# Patient Record
Sex: Male | Born: 1985 | Race: White | Hispanic: No | Marital: Married | Smoking: Never smoker
Health system: Southern US, Community
[De-identification: ages and names within clinical notes are randomized; demographics above are authoritative.]

## PROBLEM LIST (undated history)

## (undated) DIAGNOSIS — K219 Gastro-esophageal reflux disease without esophagitis: Secondary | ICD-10-CM

## (undated) DIAGNOSIS — R51 Headache: Secondary | ICD-10-CM

## (undated) DIAGNOSIS — R519 Headache, unspecified: Secondary | ICD-10-CM

## (undated) DIAGNOSIS — F329 Major depressive disorder, single episode, unspecified: Secondary | ICD-10-CM

## (undated) DIAGNOSIS — K4090 Unilateral inguinal hernia, without obstruction or gangrene, not specified as recurrent: Secondary | ICD-10-CM

## (undated) DIAGNOSIS — F32A Depression, unspecified: Secondary | ICD-10-CM

## (undated) DIAGNOSIS — M1990 Unspecified osteoarthritis, unspecified site: Secondary | ICD-10-CM

## (undated) DIAGNOSIS — N200 Calculus of kidney: Secondary | ICD-10-CM

## (undated) DIAGNOSIS — F419 Anxiety disorder, unspecified: Secondary | ICD-10-CM

## (undated) HISTORY — DX: Anxiety disorder, unspecified: F41.9

## (undated) HISTORY — DX: Unilateral inguinal hernia, without obstruction or gangrene, not specified as recurrent: K40.90

## (undated) HISTORY — DX: Hemochromatosis, unspecified: E83.119

## (undated) HISTORY — DX: Depression, unspecified: F32.A

## (undated) HISTORY — DX: Major depressive disorder, single episode, unspecified: F32.9

---
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
## 2003-03-14 HISTORY — PX: WISDOM TOOTH EXTRACTION: SHX21

## 2011-08-12 DIAGNOSIS — N200 Calculus of kidney: Secondary | ICD-10-CM

## 2011-08-12 HISTORY — DX: Calculus of kidney: N20.0

## 2011-08-26 ENCOUNTER — Encounter (HOSPITAL_COMMUNITY): Payer: Self-pay | Admitting: Emergency Medicine

## 2011-08-26 ENCOUNTER — Emergency Department (HOSPITAL_COMMUNITY)
Admission: EM | Admit: 2011-08-26 | Discharge: 2011-08-26 | Disposition: A | Discharge status: Home / Self Care | Payer: Medicaid Other | Attending: Emergency Medicine | Admitting: Emergency Medicine

## 2011-08-26 DIAGNOSIS — N390 Urinary tract infection, site not specified: Secondary | ICD-10-CM | POA: Insufficient documentation

## 2011-08-26 LAB — URINE MICROSCOPIC-ADD ON

## 2011-08-26 LAB — URINALYSIS, ROUTINE W REFLEX MICROSCOPIC
Glucose, UA: NEGATIVE mg/dL
Ketones, ur: 15 mg/dL — AB
Nitrite: POSITIVE — AB
Protein, ur: NEGATIVE mg/dL
Specific Gravity, Urine: 1.012 (ref 1.005–1.030)
Urobilinogen, UA: 1 mg/dL (ref 0.0–1.0)
pH: 6 (ref 5.0–8.0)

## 2011-08-26 MED ORDER — CEPHALEXIN 500 MG PO CAPS: 500.0000 mg | ORAL_CAPSULE | Freq: Two times a day (BID) | ORAL | Status: DC

## 2011-08-26 MED ORDER — CEFTRIAXONE SODIUM 250 MG IJ SOLR
250.0000 mg | Freq: Once | INTRAMUSCULAR | Status: AC
  Administered 2011-08-26: 250 mg via INTRAMUSCULAR
  Filled 2011-08-26: qty 250

## 2011-08-26 MED ORDER — LIDOCAINE HCL (PF) 1 % IJ SOLN
INTRAMUSCULAR | Status: AC
  Administered 2011-08-26: 5 mL
  Filled 2011-08-26: qty 5

## 2011-08-26 MED ORDER — NAPROXEN 375 MG PO TABS: 375.0000 mg | ORAL_TABLET | Freq: Two times a day (BID) | ORAL | Status: DC

## 2011-08-26 NOTE — Discharge Instructions (Signed)
Urinary Tract Infection Infections of the urinary tract can start in several places. A bladder infection (cystitis), a kidney infection (pyelonephritis), and a prostate infection (prostatitis) are different types of urinary tract infections (UTIs). They usually get better if treated with medicines (antibiotics) that kill germs. Take all the medicine until it is gone. You or your child may feel better in a few days, but TAKE ALL MEDICINE or the infection may not respond and may become more difficult to treat. HOME CARE INSTRUCTIONS   Drink enough water and fluids to keep the urine clear or pale yellow. Cranberry juice is especially recommended, in addition to large amounts of water.   Avoid caffeine, tea, and carbonated beverages. They tend to irritate the bladder.   Alcohol may irritate the prostate.   Only take over-the-counter or prescription medicines for pain, discomfort, or fever as directed by your caregiver.  To prevent further infections:  Empty the bladder often. Avoid holding urine for long periods of time.   After a bowel movement, women should cleanse from front to back. Use each tissue only once.   Empty the bladder before and after sexual intercourse.  FINDING OUT THE RESULTS OF YOUR TEST Not all test results are available during your visit. If your or your child's test results are not back during the visit, make an appointment with your caregiver to find out the results. Do not assume everything is normal if you have not heard from your caregiver or the medical facility. It is important for you to follow up on all test results. SEEK MEDICAL CARE IF:   There is back pain.   Your baby is older than 3 months with a rectal temperature of 100.5 F (38.1 C) or higher for more than 1 day.   Your or your child's problems (symptoms) are no better in 3 days. Return sooner if you or your child is getting worse.  SEEK IMMEDIATE MEDICAL CARE IF:   There is severe back pain or lower  abdominal pain.   You or your child develops chills.   You have a fever.   Your baby is older than 3 months with a rectal temperature of 102 F (38.9 C) or higher.   Your baby is 3 months old or younger with a rectal temperature of 100.4 F (38 C) or higher.   There is nausea or vomiting.   There is continued burning or discomfort with urination.  MAKE SURE YOU:   Understand these instructions.   Will watch your condition.   Will get help right away if you are not doing well or get worse.  Document Released: 12/07/2004 Document Revised: 02/16/2011 Document Reviewed: 07/12/2006 [employer] Patient Information 2012 [employer].Urinary Tract Infection Infections of the urinary tract can start in several places. A bladder infection (cystitis), a kidney infection (pyelonephritis), and a prostate infection (prostatitis) are different types of urinary tract infections (UTIs). They usually get better if treated with medicines (antibiotics) that kill germs. Take all the medicine until it is gone. You or your child may feel better in a few days, but TAKE ALL MEDICINE or the infection may not respond and may become more difficult to treat. HOME CARE INSTRUCTIONS   Drink enough water and fluids to keep the urine clear or pale yellow. Cranberry juice is especially recommended, in addition to large amounts of water.   Avoid caffeine, tea, and carbonated beverages. They tend to irritate the bladder.   Alcohol may irritate the prostate.   Only take   over-the-counter or prescription medicines for pain, discomfort, or fever as directed by your caregiver.  To prevent further infections:  Empty the bladder often. Avoid holding urine for long periods of time.   After a bowel movement, women should cleanse from front to back. Use each tissue only once.   Empty the bladder before and after sexual intercourse.  FINDING OUT THE RESULTS OF YOUR TEST Not all test results are available during your  visit. If your or your child's test results are not back during the visit, make an appointment with your caregiver to find out the results. Do not assume everything is normal if you have not heard from your caregiver or the medical facility. It is important for you to follow up on all test results. SEEK MEDICAL CARE IF:   There is back pain.   Your baby is older than 3 months with a rectal temperature of 100.5 F (38.1 C) or higher for more than 1 day.   Your or your child's problems (symptoms) are no better in 3 days. Return sooner if you or your child is getting worse.  SEEK IMMEDIATE MEDICAL CARE IF:   There is severe back pain or lower abdominal pain.   You or your child develops chills.   You have a fever.   Your baby is older than 3 months with a rectal temperature of 102 F (38.9 C) or higher.   Your baby is 3 months old or younger with a rectal temperature of 100.4 F (38 C) or higher.   There is nausea or vomiting.   There is continued burning or discomfort with urination.  MAKE SURE YOU:   Understand these instructions.   Will watch your condition.   Will get help right away if you are not doing well or get worse.  Document Released: 12/07/2004 Document Revised: 02/16/2011 Document Reviewed: 07/12/2006 [employer] Patient Information 2012 [employer]. 

## 2011-08-26 NOTE — ED Notes (Signed)
Pt denies any pain or questions upon discharge. 

## 2011-08-26 NOTE — ED Provider Notes (Signed)
History     CSN: [id number]  Arrival date & time 08/26/11  2006   First MD Initiated Contact with Patient 08/26/11 2122      Chief Complaint  Patient presents with  . Groin Pain    (Consider location/radiation/quality/duration/timing/severity/associated sxs/prior treatment) HPI Comments: Patient with no significant past medical history presents emergency department with chief complaint of right-sided flank pain and testicular pain.  Patient states his pain began acutely this evening and is associated with dysuria.  Patient states originally the pain was severe 10/10, however patient believes he may pass a kidney stone because his pain has subsided.  Patient denies fevers, night sweats, chills, abdominal pain but reports nausea and emesis when the pain was at its worst.  Patient has no other complaints at this time.  Patient is a 26 y.o. male presenting with groin pain. The history is provided by the patient.  Groin Pain    History reviewed. No pertinent past medical history.  History reviewed. No pertinent past surgical history.  History reviewed. No pertinent family history.  History  Substance Use Topics  . Smoking status: Never Smoker   . Smokeless tobacco: Not on file  . Alcohol Use: Yes     Occassional Use      Review of Systems  All other systems reviewed and are negative.    Allergies  Review of patient's allergies indicates no known allergies.  Home Medications  No current outpatient prescriptions on file.  BP 129/74  Pulse 79  Temp 98.3 F (36.8 C) (Oral)  Resp 18  SpO2 98%  Physical Exam  Nursing note and vitals reviewed. Constitutional: He is oriented to person, place, and time. He appears well-developed and well-nourished. No distress.       Patient with vital signs normal in no acute distress and did not appear to be in any pain at this time.  HENT:  Head: Normocephalic and atraumatic.  Eyes: Conjunctivae and EOM are normal.  Neck: Normal  range of motion.  Pulmonary/Chest: Effort normal.  Abdominal:       No suprapubic or lower abdominal tenderness to palpation.  Genitourinary:       Exam chaperoned, cremaster reflex intact, no testicular warmth, erythema, tenderness or swelling.  No right or left CVA tenderness, penis normal.  Musculoskeletal: Normal range of motion.  Neurological: He is alert and oriented to person, place, and time.  Skin: Skin is warm and dry. No rash noted. He is not diaphoretic.  Psychiatric: He has a normal mood and affect. His behavior is normal.    ED Course  Procedures (including critical care time)  Labs Reviewed  URINALYSIS, ROUTINE W REFLEX MICROSCOPIC - Abnormal; Notable for the following:    Color, Urine ORANGE (*)  BIOCHEMICALS MAY BE AFFECTED BY COLOR   APPearance CLOUDY (*)     Hgb urine dipstick LARGE (*)     Bilirubin Urine SMALL (*)     Ketones, ur 15 (*)     Nitrite POSITIVE (*)     Leukocytes, UA SMALL (*)     All other components within normal limits  URINE MICROSCOPIC-ADD ON   No results found.   No diagnosis found.    MDM  Urinary tract infection, question passed kidney stone  Pt has been diagnosed with a UTI. Pt is afebrile, no CVA tenderness, normotensive, and denies N/V. Pt to be dc home with antibiotics and instructions to follow up with PCP if symptoms persist.  Jaci Carrel, [place] 08/26/11 2209

## 2011-08-26 NOTE — ED Notes (Signed)
Patient complaining of right testicle swelling that started suddenly tonight.  Patient states that he took 1200 mg of ibuprofen; and that the pain has went away on the way to the ED.  Patient also reporting right flank pain and feels that he may have passed a kidney stone.  Patient reports urinary frequency and little urinary output.

## 2011-08-27 NOTE — ED Provider Notes (Signed)
Medical screening examination/treatment/procedure(s) were performed by non-physician practitioner and as supervising physician I was immediately available for consultation/collaboration.    Nelia Shi, MD 08/27/11 [phone]

## 2011-08-31 ENCOUNTER — Encounter (HOSPITAL_COMMUNITY): Payer: Self-pay | Admitting: *Deleted

## 2011-08-31 ENCOUNTER — Emergency Department (HOSPITAL_COMMUNITY): Payer: Medicaid Other

## 2011-08-31 ENCOUNTER — Emergency Department (HOSPITAL_COMMUNITY)
Admission: EM | Admit: 2011-08-31 | Discharge: 2011-08-31 | Disposition: A | Discharge status: Home / Self Care | Payer: Medicaid Other | Attending: Emergency Medicine | Admitting: Emergency Medicine

## 2011-08-31 DIAGNOSIS — R109 Unspecified abdominal pain: Secondary | ICD-10-CM | POA: Insufficient documentation

## 2011-08-31 DIAGNOSIS — Z87442 Personal history of urinary calculi: Secondary | ICD-10-CM | POA: Insufficient documentation

## 2011-08-31 DIAGNOSIS — F17200 Nicotine dependence, unspecified, uncomplicated: Secondary | ICD-10-CM | POA: Insufficient documentation

## 2011-08-31 DIAGNOSIS — N200 Calculus of kidney: Secondary | ICD-10-CM | POA: Insufficient documentation

## 2011-08-31 DIAGNOSIS — R300 Dysuria: Secondary | ICD-10-CM | POA: Insufficient documentation

## 2011-08-31 HISTORY — DX: Calculus of kidney: N20.0

## 2011-08-31 LAB — URINALYSIS, ROUTINE W REFLEX MICROSCOPIC
Bilirubin Urine: NEGATIVE
Ketones, ur: NEGATIVE mg/dL
Nitrite: NEGATIVE
Protein, ur: 100 mg/dL — AB
Urobilinogen, UA: 0.2 mg/dL (ref 0.0–1.0)

## 2011-08-31 MED ORDER — OXYCODONE-ACETAMINOPHEN 10-650 MG PO TABS: 1.0000 | ORAL_TABLET | Freq: Four times a day (QID) | ORAL | Status: AC | PRN

## 2011-08-31 MED ORDER — KETOROLAC TROMETHAMINE 30 MG/ML IJ SOLN
30.0000 mg | Freq: Once | INTRAMUSCULAR | Status: AC
  Administered 2011-08-31: 30 mg via INTRAVENOUS
  Filled 2011-08-31: qty 1

## 2011-08-31 MED ORDER — DEXTROSE 5 % IV SOLN
1.0000 g | Freq: Once | INTRAVENOUS | Status: AC
  Administered 2011-08-31: 1 g via INTRAVENOUS
  Filled 2011-08-31: qty 10

## 2011-08-31 MED ORDER — SODIUM CHLORIDE 0.9 % IV BOLUS (SEPSIS)
500.0000 mL | Freq: Once | INTRAVENOUS | Status: AC
  Administered 2011-08-31: 500 mL via INTRAVENOUS

## 2011-08-31 NOTE — Discharge Instructions (Signed)
Mr Dunker you have a kidney stone 5mm on the R.  Follow up with the urologist listed below.  Continue straining your urine and continue the keflex.  Take the percocet for pain but do not drive with this medication.  Return for severe pain or other concerns.  You will be called if the cultures are positive for gonorrhea or chlamydia.   Kidney Stones Kidney stones (ureteral lithiasis) are solid masses that form inside your kidneys. The intense pain is caused by the stone moving through the kidney, ureter, bladder, and urethra (urinary tract). When the stone moves, the ureter starts to spasm around the stone. The stone is usually passed in the urine.  HOME CARE  Drink enough fluids to keep your pee (urine) clear or pale yellow. This helps to get the stone out.   Strain all pee through the provided strainer. Do not pee without peeing through the strainer, not even once. If you pee the stone out, catch it. The stone may be as small as a grain of salt. Take this to your doctor.   Only take medicine as told by your doctor.   Follow up with your doctor as told.   Get follow-up X-rays as told by your doctor.  GET HELP RIGHT AWAY IF:   Your pain does not get better with medicine.   You have a fever.   Your pain increases and gets worse over 18 hours.   You have new belly (abdominal) pain.   You feel faint or pass out.  MAKE SURE YOU:   Understand these instructions.   Will watch your condition.   Will get help right away if you are not doing well or get worse.  Document Released: 08/16/2007 Document Revised: 02/16/2011 Document Reviewed: 12/25/2008 [facility] Patient Information 2012 [place], [place].Kidney Stones Kidney stones (ureteral lithiasis) are solid masses that form inside your kidneys. The intense pain is caused by the stone moving through the kidney, ureter, bladder, and urethra (urinary tract). When the stone moves, the ureter starts to spasm around the stone. The stone is usually  passed in the urine.  HOME CARE  Drink enough fluids to keep your pee (urine) clear or pale yellow. This helps to get the stone out.   Strain all pee through the provided strainer. Do not pee without peeing through the strainer, not even once. If you pee the stone out, catch it. The stone may be as small as a grain of salt. Take this to your doctor.   Only take medicine as told by your doctor.   Follow up with your doctor as told.   Get follow-up X-rays as told by your doctor.  GET HELP RIGHT AWAY IF:   Your pain does not get better with medicine.   You have a fever.   Your pain increases and gets worse over 18 hours.   You have new belly (abdominal) pain.   You feel faint or pass out.  MAKE SURE YOU:   Understand these instructions.   Will watch your condition.   Will get help right away if you are not doing well or get worse.  Document Released: 08/16/2007 Document Revised: 02/16/2011 Document Reviewed: 12/25/2008 [facility] Patient Information 2012 [place], [place].Lithotripsy for Kidney Stones WHAT ARE KIDNEY STONES? The kidneys filter blood for chemicals the body cannot use. These waste chemicals are eliminated in the urine. They are removed from the body. Under some conditions, these chemicals may become concentrated. When this happens, they form crystals in the  urine. When these crystals build up and stick together, stones may form. When these stones block the flow of urine through the urinary tract, they may cause severe pain. The urinary tract is very sensitive to blockage and stretching by the stone. WHAT IS LITHOTRIPSY? Lithotripsy is a treatment that can sometimes help eliminate kidney stones and pain faster. A form of lithotripsy, also known as ESWL (extracorporeal shock wave lithotripsy), is a nonsurgical procedure that helps your body rid itself of the kidney stone with a minimum amount of pain. EWSL is a method of crushing a kidney stone with shock waves. These shock  waves pass through your body. They cause the kidney stones to crumble while still in the urinary tract. It is then easier for the smaller pieces of stone to pass in the urine. Lithotripsy usually takes about an hour. It is done in a hospital, a lithotripsy center, or a mobile unit. It usually does not require an overnight stay. Your caregiver will instruct you on preparation for the procedure. Your caregiver will tell you what to expect afterward. LET YOUR CAREGIVER KNOW ABOUT:  Allergies.   Medicines taken including herbs, eye drops, over the counter medicines (including aspirin, aleve, or motrin for treatment of inflammatory conditions) and creams.   Use of steroids (by mouth or creams).   Previous problems with anesthetics or novocaine.   Possibility of pregnancy, if this applies.   History of blood clots (thrombophlebitis).   History of bleeding or blood problems.   Previous surgery.   Other health problems.  RISKS AND COMPLICATIONS Complications of lithotripsy are uncommon, but include the following:  Infection.   Bleeding of the kidney.   Bruising of the kidney or skin.   Obstruction of the ureter (the passageway from the kidney to the bladder).   Failure of the stone to fragment (break apart).  PROCEDURE A stent (flexible tube with holes) may be placed in your ureter. The ureter is the tube that transports the urine from the kidneys to the bladder. Your caregiver may place a stent before the procedure. This will help keep urine flowing from the kidney if the fragments of the stone block the ureter. You may receive an intravenous (IV) line to give you fluids and medicines. These medicines may help you relax or make you sleep. During the procedure, you will lie comfortably on a fluid-filled cushion or in a warm-water bath. After an x-ray or ultrasound locates your stone, shock waves are aimed at the stone. If you are awake, you may feel a tapping sensation (feeling) as the  shock waves pass through your body. If large stone particles remain after treatment, a second procedure may be necessary at a later date. For comfort during the test:  Relax as much as possible.   Try to remain still as much as possible.   Try to follow instructions to speed up the test.   Let your caregiver know if you are uncomfortable, anxious, or in pain.  AFTER THE PROCEDURE  After surgery, you will be taken to the recovery area. A nurse will watch and check your progress. Once you're awake, stable, and taking fluids well, you will be allowed to go home as long as there are no problems. You may be prescribed antibiotics (medicines that kill germs) to help prevent infection. You may also be prescribed pain medicine if needed. In a week or two, your doctor may remove your stent, if you have one. Your caregiver will check to see whether  or not stone particles remain. PASSING THE STONE It may take anywhere from a day to several weeks for the stone particles to leave your body. During this time, drink at least 8 to 12 eight ounce glasses of water every day. It is normal for your urine to be cloudy or slightly bloody for a few weeks following this procedure. You may even see small pieces of stone in your urine. A slight fever and some pain are also normal. Your caregiver may ask you to strain your urine to collect some stone particles for chemical analysis. If you find particles while straining the urine, save them. Analysis tells you and the caregiver what the stone is made of. Knowing this may help prevent future stones. PREVENTING FUTURE STONES  Drink about 8 to 12, eight-ounce glasses of water every day.   Follow the diet your caregiver recommends.   Take your prescribed medicine.   See your caregiver regularly for checkups.  SEEK IMMEDIATE MEDICAL CARE IF:  You develop an oral temperature above 102 F (38.9 C), or as your caregiver suggests.   Your pain is not relieved by medicine.     You develop nausea (feeling sick to your stomach) and vomiting.   You develop heavy bleeding.   You have difficulty urinating.  Document Released: 02/25/2000 Document Revised: 02/16/2011 Document Reviewed: 12/20/2007 [facility] Patient Information 2012 [place], [place].

## 2011-08-31 NOTE — ED Notes (Signed)
Patient reports he was seen on Saturday and dx with kidney stones.  He is continuing to having right side flank pain and he cannot rest.  Patient states he has pain down into his right testicle.  Patient is able to pass urine, but small amount than usual

## 2011-08-31 NOTE — ED Notes (Signed)
Pt returned from CT °

## 2011-08-31 NOTE — ED Provider Notes (Signed)
History     CSN: [id number]  Arrival date & time 08/31/11  1425   First MD Initiated Contact with Patient 08/31/11 1825      Chief Complaint  Patient presents with  . Flank Pain    (Consider location/radiation/quality/duration/timing/severity/associated sxs/prior treatment) Patient is a 26 y.o. male presenting with flank pain. The history is provided by the patient and a significant other. No language interpreter was used.  Flank Pain This is a recurrent problem. The current episode started in the past 7 days. The problem occurs daily.   26 year old coming in today with recent history of UTI and possible kidney stone with no CT of abdomen. Patient states the pain is getting worse and he does have burning with urination and right testicular pain with urination only. Patient was put on Keflex 4 days ago with no improvement. He does have right CVA tenderness as well. States he does have a clear drainage after urinating from his penis. Intermittant testicular pain.  None now.  His girlfriend states that she recently had HPV and no LEEP procedure done. Patient states he has been unable to work and he will need a work note.    Past Medical History  Diagnosis Date  . Kidney stone     History reviewed. No pertinent past surgical history.  No family history on file.  History  Substance Use Topics  . Smoking status: Current Some Day Smoker  . Smokeless tobacco: Not on file  . Alcohol Use: Yes     Occassional Use      Review of Systems  Constitutional: Negative.   HENT: Negative.   Eyes: Negative.   Respiratory: Negative.   Cardiovascular: Negative.   Gastrointestinal: Negative.   Genitourinary: Positive for dysuria, urgency, flank pain, discharge and difficulty urinating. Negative for penile swelling, penile pain and testicular pain.  Neurological: Negative.   Psychiatric/Behavioral: Negative.   All other systems reviewed and are negative.    Allergies  Review of  patient's allergies indicates no known allergies.  Home Medications   Current Outpatient Rx  Name Route Sig Dispense Refill  . CEPHALEXIN 500 MG PO CAPS Oral Take 500 mg by mouth 2 (two) times daily. Take for 10 days. First dose 08/27/2011    . CRANBERRY PO Oral Take 1 tablet by mouth daily.    Marland Kitchen NAPROXEN 375 MG PO TABS Oral Take 1 tablet (375 mg total) by mouth 2 (two) times daily. 20 tablet 0    BP 143/86  Pulse 80  Temp 97.6 F (36.4 C) (Oral)  Resp 18  Ht 5\' 11"  (1.803 m)  Wt 162 lb (73.483 kg)  BMI 22.59 kg/m2  SpO2 100%  Physical Exam  Nursing note and vitals reviewed. Constitutional: He is oriented to person, place, and time. He appears well-developed and well-nourished.  HENT:  Head: Normocephalic.  Eyes: Conjunctivae and EOM are normal. Pupils are equal, round, and reactive to light.  Neck: Normal range of motion. Neck supple.  Cardiovascular: Normal rate.   Pulmonary/Chest: Effort normal.  Abdominal: Soft.  Genitourinary: Testes normal and penis normal. Right testis shows no mass, no swelling and no tenderness. Left testis shows no mass, no swelling and no tenderness.  Musculoskeletal: Normal range of motion.  Neurological: He is alert and oriented to person, place, and time.  Skin: Skin is warm and dry.  Psychiatric: He has a normal mood and affect.    ED Course  Procedures (including critical care time)   Labs Reviewed  URINALYSIS,  ROUTINE W REFLEX MICROSCOPIC   No results found.   No diagnosis found.    MDM  CVA tenderness with + kidney stone per CT scan.  On keflex from evaluation last week with + UTI.    U/a normal today. Continue keflex and follow up with urologist. GC/chlamydia pending. Strain urine.  Return for n/v or high fever severe pain.   Labs Reviewed  URINALYSIS, ROUTINE W REFLEX MICROSCOPIC - Abnormal; Notable for the following:    APPearance CLOUDY (*)     Specific Gravity, Urine 1.035 (*)     Hgb urine dipstick LARGE (*)      Protein, ur 100 (*)     All other components within normal limits  URINE MICROSCOPIC-ADD ON - Abnormal; Notable for the following:    Crystals CA OXALATE CRYSTALS (*)     All other components within normal limits  GC/CHLAMYDIA PROBE AMP, GENITAL          Remi Haggard, NP 09/01/11 1656

## 2011-08-31 NOTE — ED Notes (Signed)
Pt transported to CT ?

## 2011-09-01 LAB — GC/CHLAMYDIA PROBE AMP, GENITAL: Chlamydia, DNA Probe: NEGATIVE

## 2011-09-04 NOTE — ED Provider Notes (Signed)
Medical screening examination/treatment/procedure(s) were performed by non-physician practitioner and as supervising physician I was immediately available for consultation/collaboration.   Carleene Cooper III, MD 09/04/11 2034

## 2011-09-19 ENCOUNTER — Other Ambulatory Visit: Payer: Self-pay | Admitting: Urology

## 2011-09-19 ENCOUNTER — Encounter (HOSPITAL_COMMUNITY): Payer: Self-pay | Admitting: *Deleted

## 2011-09-19 NOTE — Progress Notes (Signed)
Pt instructed to avoid all aspirin/ibuprofen products. NPO after midnight for solids and clear liquids until 0945 AM of procedure. To bring blue folder and responsible driver. Patient does not have insurance and will be referred to financial counselor while here. Reviewed need for laxative day before procedure between 3-6 PM. Teach back done and pt is able to answer questions. Needs reinforcement on when to do bowel prep.

## 2011-09-20 ENCOUNTER — Encounter (HOSPITAL_COMMUNITY): Payer: Self-pay | Admitting: Pharmacy Technician

## 2011-09-21 ENCOUNTER — Ambulatory Visit (HOSPITAL_COMMUNITY): Admission: RE | Admit: 2011-09-21 | Payer: Self-pay | Source: Ambulatory Visit | Admitting: Urology

## 2011-09-21 SURGERY — LITHOTRIPSY, ESWL
Anesthesia: LOCAL | Laterality: Right

## 2011-09-21 MED ORDER — DIPHENHYDRAMINE HCL 25 MG PO CAPS: 25.0000 mg | ORAL_CAPSULE | ORAL | Status: DC

## 2011-09-21 MED ORDER — DIAZEPAM 5 MG PO TABS: 10.0000 mg | ORAL_TABLET | ORAL | Status: DC

## 2011-09-21 MED ORDER — DEXTROSE-NACL 5-0.45 % IV SOLN: INTRAVENOUS | Status: DC

## 2011-09-21 MED ORDER — CIPROFLOXACIN IN D5W 400 MG/200ML IV SOLN: 400.0000 mg | INTRAVENOUS | Status: DC

## 2011-11-05 ENCOUNTER — Encounter (HOSPITAL_COMMUNITY): Payer: Self-pay | Admitting: *Deleted

## 2011-11-05 DIAGNOSIS — F17200 Nicotine dependence, unspecified, uncomplicated: Secondary | ICD-10-CM | POA: Insufficient documentation

## 2011-11-05 DIAGNOSIS — R109 Unspecified abdominal pain: Secondary | ICD-10-CM | POA: Insufficient documentation

## 2011-11-05 NOTE — ED Notes (Signed)
Pt states that he was treated for a kidney stone recently. Pt states that he was then a couple of says ago experiencing testicular pain. Pt states hurts when he pees, and when he was in the shower tonight he felt a sharper pain than normal. Pt states that he has also been having abominal pain.

## 2011-11-06 ENCOUNTER — Emergency Department (HOSPITAL_COMMUNITY)
Admission: EM | Admit: 2011-11-06 | Discharge: 2011-11-06 | Disposition: A | Discharge status: Home / Self Care | Payer: Medicaid Other | Attending: Emergency Medicine | Admitting: Emergency Medicine

## 2011-11-06 DIAGNOSIS — R1031 Right lower quadrant pain: Secondary | ICD-10-CM

## 2011-11-06 LAB — URINALYSIS, ROUTINE W REFLEX MICROSCOPIC
Ketones, ur: NEGATIVE mg/dL
Leukocytes, UA: NEGATIVE
Nitrite: NEGATIVE
Specific Gravity, Urine: 1.01 (ref 1.005–1.030)
pH: 6.5 (ref 5.0–8.0)

## 2011-11-06 NOTE — ED Provider Notes (Signed)
History     CSN: [id number]  Arrival date & time 11/05/11  2022   First MD Initiated Contact with Patient 11/06/11 0117      Chief Complaint  Patient presents with  . Testicle Pain    (Consider location/radiation/quality/duration/timing/severity/associated sxs/prior treatment) The history is provided by the patient.   patient reports his had intermittent right groin pain for several days.  This evening while in the shower he developed some severe sharp pain in his right groin.  He felt like there was a swelling and tenderness overlying his right groin.  The pain did radiate down towards his right testicle.  He denies penile discharge or urinary frequency.  He does have a history of ureteral stones.  He reports this feels different than that.  He denies testicular pain.  He reports the pain is more in his right groin and radiates towards his testicle.  He reports when the pain came on he became slightly nauseated.  His had no vomiting.  He reports now at time of my evaluation he feels much better.  He has no pain or nausea at this time.  Past Medical History  Diagnosis Date  . Kidney stone 08/2011    Past Surgical History  Procedure Date  . Wisdom tooth extraction 2005    History reviewed. No pertinent family history.  History  Substance Use Topics  . Smoking status: Current Some Day Smoker  . Smokeless tobacco: Not on file  . Alcohol Use: Yes     Occassional Use      Review of Systems  Genitourinary: Positive for testicular pain.  All other systems reviewed and are negative.    Allergies  Codeine and Oxycodone  Home Medications   Current Outpatient Rx  Name Route Sig Dispense Refill  . CRANBERRY PO Oral Take 1 tablet by mouth daily.    Marland Kitchen TAMSULOSIN HCL 0.4 MG PO CAPS Oral Take 0.4 mg by mouth daily.      BP 115/74  Pulse 62  Temp 98.3 F (36.8 C) (Oral)  Resp 18  SpO2 100%  Physical Exam  Nursing note and vitals reviewed. Constitutional: He is oriented  to person, place, and time. He appears well-developed and well-nourished.  HENT:  Head: Normocephalic and atraumatic.  Eyes: EOM are normal.  Neck: Normal range of motion.  Cardiovascular: Normal rate.   Pulmonary/Chest: Effort normal. No respiratory distress.  Abdominal: Soft. He exhibits no distension. There is no tenderness. There is no rebound and no guarding.  Genitourinary:       nml penis. nml scrotum.  No tenderness of right testicle.  No swelling a right inguinal canal.  No palpable hernia.  Musculoskeletal: Normal range of motion.  Neurological: He is alert and oriented to person, place, and time.  Skin: Skin is warm and dry.  Psychiatric: He has a normal mood and affect. Judgment normal.    ED Course  Procedures (including critical care time)   Labs Reviewed  URINALYSIS, ROUTINE W REFLEX MICROSCOPIC   No results found.   1. Right groin pain       MDM  Given his description of the pain I suspect this may have been a right inguinal hernia that self reduced at home.  I don't feel palpable hernia at this time.  The patient will need general surgery followup.  He understands return to the emergency partner for new or worsening symptoms.  I doubt this is ureteral colic.  The patient's urine is normal.  I  don't believe this to be testicular torsion.  He has normal lie of his testicles bilaterally and no testicular tenderness.        Lyanne Co, MD 11/06/11 [phone]

## 2011-11-09 ENCOUNTER — Encounter (INDEPENDENT_AMBULATORY_CARE_PROVIDER_SITE_OTHER): Payer: Self-pay | Admitting: General Surgery

## 2011-11-21 ENCOUNTER — Encounter (INDEPENDENT_AMBULATORY_CARE_PROVIDER_SITE_OTHER): Payer: Self-pay | Admitting: General Surgery

## 2011-11-21 ENCOUNTER — Ambulatory Visit (INDEPENDENT_AMBULATORY_CARE_PROVIDER_SITE_OTHER): Payer: Medicaid Other | Admitting: General Surgery

## 2011-11-21 VITALS — BP 120/72 | HR 60 | Temp 97.8°F | Resp 14 | Ht 70.0 in | Wt 149.2 lb

## 2011-11-21 DIAGNOSIS — K4090 Unilateral inguinal hernia, without obstruction or gangrene, not specified as recurrent: Secondary | ICD-10-CM | POA: Insufficient documentation

## 2011-11-21 NOTE — Patient Instructions (Signed)
Plan for right inguinal hernia repair with mesh 

## 2011-11-21 NOTE — Progress Notes (Signed)
Subjective:     Patient ID: Tony Parsons, male   DOB: [birth year], 26 y.o.   MRN: [id number]  HPI We are asked to see the patient in consultation by Dr. Nathanial Rancher to evaluate him for a right inguinal hernia. The patient is a 26 year old white male who has been noticing a bulge in his right groin for the last 3 or 4 weeks. He is also felt pain in his right lower quadrant. He felt the pain was similar to his kidney stone pain so he went to the emergency department where they felt the inguinal hernia. He has had a couple episodes of nausea and vomiting. His bowels are moving regularly.  Review of Systems  Constitutional: Negative.   HENT: Negative.   Eyes: Negative.   Respiratory: Negative.   Cardiovascular: Negative.   Gastrointestinal: Negative.   Genitourinary: Negative.   Musculoskeletal: Negative.   Skin: Negative.   Neurological: Negative.   Hematological: Negative.   Psychiatric/Behavioral: Negative.        Objective:   Physical Exam  Constitutional: He is oriented to person, place, and time. He appears well-developed and well-nourished.  HENT:  Head: Normocephalic and atraumatic.  Eyes: Conjunctivae and EOM are normal. Pupils are equal, round, and reactive to light.  Neck: Normal range of motion. Neck supple.  Cardiovascular: Normal rate, regular rhythm and normal heart sounds.   Pulmonary/Chest: Effort normal and breath sounds normal.  Abdominal: Soft. Bowel sounds are normal.  Genitourinary:       There is a small reducible right inguinal hernia that is symptomatic. No palpable bulge or impulse with straining in the left groin.  Musculoskeletal: Normal range of motion.  Neurological: He is alert and oriented to person, place, and time.  Skin: Skin is warm and dry.  Psychiatric: He has a normal mood and affect. His behavior is normal.       Assessment:     The patient has a symptomatic small right inguinal hernia. Because of the risk of incarceration and strangulation I  think he would benefit from having this fixed. I have discussed with him in detail the risks and benefits of the operation a fixed hernia as well as some of the technical aspects including the use of mesh and the risk of injury to the vas deferens and testicular artery and he understands and wishes to proceed    Plan:     Plan for right inguinal hernia repair with mesh

## 2011-12-15 ENCOUNTER — Telehealth (INDEPENDENT_AMBULATORY_CARE_PROVIDER_SITE_OTHER): Payer: Self-pay

## 2011-12-15 NOTE — Telephone Encounter (Signed)
The patient's mom called to report he is having a lot of pain with his inguinal hernia.  He is on Tramadol provided by his medical md.  The hernia seems a little larger but still pops in and out.  He is now having difficulty urinating for a couple days.  He has seen Dr Retta Diones before for kidney stones.  The mom is not on his chart to be spoken to so she gave me the patient's numbers and the dad's cell of [phone].  I attempted many times to reach the patient and got no answer.  I wanted to speak to him.  I paged Dr Carolynne Edouard and he advised he should go to the ER or his urologist.  He states he can't do surgery any sooner.  I called the dad back and informed him.  He will tell the patient.  He thinks he may be having kidney stones again.

## 2011-12-18 ENCOUNTER — Encounter (HOSPITAL_COMMUNITY): Payer: Self-pay

## 2011-12-18 ENCOUNTER — Encounter (HOSPITAL_COMMUNITY)
Admission: RE | Admit: 2011-12-18 | Discharge: 2011-12-18 | Disposition: A | Discharge status: Home / Self Care | Payer: Medicaid Other | Source: Ambulatory Visit | Attending: General Surgery | Admitting: General Surgery

## 2011-12-18 LAB — CBC
HCT: 46.5 % (ref 39.0–52.0)
Hemoglobin: 15.7 g/dL (ref 13.0–17.0)
MCH: 30.3 pg (ref 26.0–34.0)
MCHC: 33.8 g/dL (ref 30.0–36.0)
MCV: 89.8 fL (ref 78.0–100.0)
Platelets: 237 10*3/uL (ref 150–400)
RBC: 5.18 MIL/uL (ref 4.22–5.81)
RDW: 13.5 % (ref 11.5–15.5)
WBC: 7.1 10*3/uL (ref 4.0–10.5)

## 2011-12-18 LAB — SURGICAL PCR SCREEN
MRSA, PCR: NEGATIVE
Staphylococcus aureus: POSITIVE — AB

## 2011-12-18 NOTE — Pre-Procedure Instructions (Signed)
18 Tony Parsons  12/18/2011   Your procedure is scheduled on:  Monday, October 14  Report to [facility] at 0830 AM.  Call this number if you have problems the morning of surgery: [phone]   Remember:   Do not eat food:After Midnight.  Take these medicines the morning of surgery with A SIP OF WATER: Lexapro   Do not wear jewelry, make-up or nail polish.  Do not wear lotions, powders, or perfumes. You may wear deodorant.  Do not shave 48 hours prior to surgery. Men may shave face and neck.  Do not bring valuables to the hospital.  Contacts, dentures or bridgework may not be worn into surgery.  Leave suitcase in the car. After surgery it may be brought to your room.  For patients admitted to the hospital, checkout time is 11:00 AM the day of discharge.   Patients discharged the day of surgery will not be allowed to drive home.  Name and phone number of your driver:   Special Instructions: Shower using CHG 2 nights before surgery and the night before surgery.  If you shower the day of surgery use CHG.  Use special wash - you have one bottle of CHG for all showers.  You should use approximately 1/3 of the bottle for each shower.   Please read over the following fact sheets that you were given: Pain Booklet, Coughing and Deep Breathing, MRSA Information and Surgical Site Infection Prevention

## 2011-12-24 MED ORDER — CEFAZOLIN SODIUM-DEXTROSE 2-3 GM-% IV SOLR: 2.0000 g | INTRAVENOUS | Status: DC

## 2011-12-25 ENCOUNTER — Ambulatory Visit (HOSPITAL_COMMUNITY): Payer: Medicaid Other | Admitting: Anesthesiology

## 2011-12-25 ENCOUNTER — Encounter (HOSPITAL_COMMUNITY)
Admission: RE | Disposition: A | Discharge status: Home / Self Care | Payer: Self-pay | Source: Ambulatory Visit | Attending: General Surgery

## 2011-12-25 ENCOUNTER — Other Ambulatory Visit (INDEPENDENT_AMBULATORY_CARE_PROVIDER_SITE_OTHER): Payer: Self-pay

## 2011-12-25 ENCOUNTER — Telehealth (INDEPENDENT_AMBULATORY_CARE_PROVIDER_SITE_OTHER): Payer: Self-pay

## 2011-12-25 ENCOUNTER — Encounter (HOSPITAL_COMMUNITY): Payer: Self-pay | Admitting: Anesthesiology

## 2011-12-25 ENCOUNTER — Encounter (HOSPITAL_COMMUNITY): Payer: Self-pay | Admitting: *Deleted

## 2011-12-25 ENCOUNTER — Ambulatory Visit (HOSPITAL_COMMUNITY)
Admission: RE | Admit: 2011-12-25 | Discharge: 2011-12-25 | Disposition: A | Discharge status: Home / Self Care | Payer: Medicaid Other | Source: Ambulatory Visit | Attending: General Surgery | Admitting: General Surgery

## 2011-12-25 DIAGNOSIS — K4090 Unilateral inguinal hernia, without obstruction or gangrene, not specified as recurrent: Secondary | ICD-10-CM

## 2011-12-25 DIAGNOSIS — G8918 Other acute postprocedural pain: Secondary | ICD-10-CM

## 2011-12-25 DIAGNOSIS — Z01812 Encounter for preprocedural laboratory examination: Secondary | ICD-10-CM | POA: Insufficient documentation

## 2011-12-25 HISTORY — PX: INGUINAL HERNIA REPAIR: SHX194

## 2011-12-25 SURGERY — REPAIR, HERNIA, INGUINAL, ADULT
Anesthesia: General | Site: Groin | Laterality: Right | Wound class: Clean

## 2011-12-25 MED ORDER — LACTATED RINGERS IV SOLN
INTRAVENOUS | Status: DC | PRN
  Administered 2011-12-25: 10:00:00 via INTRAVENOUS

## 2011-12-25 MED ORDER — HYDROCODONE-ACETAMINOPHEN 5-325 MG PO TABS: 1.0000 | ORAL_TABLET | ORAL | Status: DC | PRN

## 2011-12-25 MED ORDER — FENTANYL CITRATE 0.05 MG/ML IJ SOLN
25.0000 ug | INTRAMUSCULAR | Status: DC | PRN
  Administered 2011-12-25: 50 ug via INTRAVENOUS

## 2011-12-25 MED ORDER — HYDROCODONE-ACETAMINOPHEN 5-325 MG PO TABS
ORAL_TABLET | ORAL | Status: AC
  Filled 2011-12-25: qty 1

## 2011-12-25 MED ORDER — CEFAZOLIN SODIUM-DEXTROSE 2-3 GM-% IV SOLR
INTRAVENOUS | Status: AC
  Filled 2011-12-25: qty 50

## 2011-12-25 MED ORDER — FENTANYL CITRATE 0.05 MG/ML IJ SOLN
INTRAMUSCULAR | Status: AC
  Filled 2011-12-25: qty 2

## 2011-12-25 MED ORDER — LIDOCAINE HCL (CARDIAC) 20 MG/ML IV SOLN
INTRAVENOUS | Status: DC | PRN
  Administered 2011-12-25: 100 mg via INTRAVENOUS

## 2011-12-25 MED ORDER — HYDROCODONE-ACETAMINOPHEN 5-325 MG PO TABS
ORAL_TABLET | ORAL | Status: AC
  Administered 2011-12-25: 1 via ORAL
  Filled 2011-12-25: qty 1

## 2011-12-25 MED ORDER — CHLORHEXIDINE GLUCONATE 4 % EX LIQD: 1.0000 "application " | Freq: Once | CUTANEOUS | Status: DC

## 2011-12-25 MED ORDER — 0.9 % SODIUM CHLORIDE (POUR BTL) OPTIME
TOPICAL | Status: DC | PRN
  Administered 2011-12-25: 1000 mL

## 2011-12-25 MED ORDER — TRAMADOL HCL 50 MG PO TABS: 50.0000 mg | ORAL_TABLET | Freq: Four times a day (QID) | ORAL | Status: AC | PRN

## 2011-12-25 MED ORDER — BUPIVACAINE-EPINEPHRINE 0.25% -1:200000 IJ SOLN
INTRAMUSCULAR | Status: DC | PRN
  Administered 2011-12-25: 30 mL

## 2011-12-25 MED ORDER — MIDAZOLAM HCL 5 MG/5ML IJ SOLN
INTRAMUSCULAR | Status: DC | PRN
  Administered 2011-12-25: 2 mg via INTRAVENOUS

## 2011-12-25 MED ORDER — BUPIVACAINE-EPINEPHRINE PF 0.25-1:200000 % IJ SOLN
INTRAMUSCULAR | Status: AC
  Filled 2011-12-25: qty 30

## 2011-12-25 MED ORDER — HYDROCODONE-ACETAMINOPHEN 5-325 MG PO TABS
1.0000 | ORAL_TABLET | Freq: Once | ORAL | Status: AC
  Administered 2011-12-25: 1 via ORAL

## 2011-12-25 MED ORDER — HYDROCODONE-ACETAMINOPHEN 5-325 MG PO TABS: 1.0000 | ORAL_TABLET | Freq: Once | ORAL | Status: DC

## 2011-12-25 MED ORDER — ONDANSETRON HCL 4 MG/2ML IJ SOLN
INTRAMUSCULAR | Status: DC | PRN
  Administered 2011-12-25: 4 mg via INTRAVENOUS

## 2011-12-25 MED ORDER — METOCLOPRAMIDE HCL 5 MG/ML IJ SOLN: 10.0000 mg | Freq: Once | INTRAMUSCULAR | Status: DC | PRN

## 2011-12-25 MED ORDER — DEXAMETHASONE SODIUM PHOSPHATE 4 MG/ML IJ SOLN
INTRAMUSCULAR | Status: DC | PRN
  Administered 2011-12-25: 4 mg via INTRAVENOUS

## 2011-12-25 MED ORDER — FENTANYL CITRATE 0.05 MG/ML IJ SOLN
INTRAMUSCULAR | Status: DC | PRN
  Administered 2011-12-25 (×3): 50 ug via INTRAVENOUS

## 2011-12-25 MED ORDER — PROPOFOL 10 MG/ML IV BOLUS
INTRAVENOUS | Status: DC | PRN
  Administered 2011-12-25: 200 mg via INTRAVENOUS

## 2011-12-25 MED ORDER — LACTATED RINGERS IV SOLN
INTRAVENOUS | Status: DC
  Administered 2011-12-25: 09:00:00 via INTRAVENOUS

## 2011-12-25 SURGICAL SUPPLY — 54 items
ADH SKN CLS APL DERMABOND .7 (GAUZE/BANDAGES/DRESSINGS) ×1
ADH SKN CLS LQ APL DERMABOND (GAUZE/BANDAGES/DRESSINGS) ×1
BLADE SURG 10 STRL SS (BLADE) ×2 IMPLANT
BLADE SURG 15 STRL LF DISP TIS (BLADE) ×1 IMPLANT
BLADE SURG 15 STRL SS (BLADE) ×2
BLADE SURG ROTATE 9660 (MISCELLANEOUS) IMPLANT
CHLORAPREP W/TINT 26ML (MISCELLANEOUS) ×2 IMPLANT
CLOTH BEACON ORANGE TIMEOUT ST (SAFETY) ×2 IMPLANT
COVER SURGICAL LIGHT HANDLE (MISCELLANEOUS) ×2 IMPLANT
DECANTER SPIKE VIAL GLASS SM (MISCELLANEOUS) ×2 IMPLANT
DERMABOND ADHESIVE PROPEN (GAUZE/BANDAGES/DRESSINGS) ×1
DERMABOND ADVANCED (GAUZE/BANDAGES/DRESSINGS) ×1
DERMABOND ADVANCED .7 DNX12 (GAUZE/BANDAGES/DRESSINGS) ×1 IMPLANT
DERMABOND ADVANCED .7 DNX6 (GAUZE/BANDAGES/DRESSINGS) IMPLANT
DRAIN PENROSE 1/2X12 LTX STRL (WOUND CARE) ×1 IMPLANT
DRAPE LAPAROSCOPIC ABDOMINAL (DRAPES) ×2 IMPLANT
DRAPE UTILITY 15X26 W/TAPE STR (DRAPE) ×4 IMPLANT
ELECT CAUTERY BLADE 6.4 (BLADE) ×2 IMPLANT
ELECT REM PT RETURN 9FT ADLT (ELECTROSURGICAL) ×2
ELECTRODE REM PT RTRN 9FT ADLT (ELECTROSURGICAL) ×1 IMPLANT
GLOVE BIO SURGEON STRL SZ 6.5 (GLOVE) ×1 IMPLANT
GLOVE BIO SURGEON STRL SZ7.5 (GLOVE) ×2 IMPLANT
GLOVE BIOGEL PI IND STRL 6.5 (GLOVE) IMPLANT
GLOVE BIOGEL PI IND STRL 7.0 (GLOVE) IMPLANT
GLOVE BIOGEL PI INDICATOR 6.5 (GLOVE) ×1
GLOVE BIOGEL PI INDICATOR 7.0 (GLOVE) ×2
GLOVE SURG SS PI 7.0 STRL IVOR (GLOVE) ×2 IMPLANT
GOWN STRL NON-REIN LRG LVL3 (GOWN DISPOSABLE) ×6 IMPLANT
KIT BASIN OR (CUSTOM PROCEDURE TRAY) ×2 IMPLANT
KIT ROOM TURNOVER OR (KITS) ×2 IMPLANT
MESH ULTRAPRO 3X6 7.6X15CM (Mesh General) ×1 IMPLANT
NDL HYPO 25GX1X1/2 BEV (NEEDLE) ×1 IMPLANT
NEEDLE HYPO 25GX1X1/2 BEV (NEEDLE) ×2 IMPLANT
NS IRRIG 1000ML POUR BTL (IV SOLUTION) ×2 IMPLANT
PACK SURGICAL SETUP 50X90 (CUSTOM PROCEDURE TRAY) ×2 IMPLANT
PAD ARMBOARD 7.5X6 YLW CONV (MISCELLANEOUS) ×4 IMPLANT
PENCIL BUTTON HOLSTER BLD 10FT (ELECTRODE) ×2 IMPLANT
SPONGE LAP 18X18 X RAY DECT (DISPOSABLE) ×2 IMPLANT
SUT MNCRL AB 4-0 PS2 18 (SUTURE) ×2 IMPLANT
SUT PROLENE 2 0 SH DA (SUTURE) ×4 IMPLANT
SUT SILK 2 0 SH (SUTURE) IMPLANT
SUT SILK 3 0 (SUTURE) ×2
SUT SILK 3-0 18XBRD TIE 12 (SUTURE) ×1 IMPLANT
SUT VIC AB 0 CT1 27 (SUTURE) ×2
SUT VIC AB 0 CT1 27XBRD ANBCTR (SUTURE) ×1 IMPLANT
SUT VIC AB 2-0 SH 27 (SUTURE) ×2
SUT VIC AB 2-0 SH 27X BRD (SUTURE) ×1 IMPLANT
SUT VIC AB 3-0 SH 27 (SUTURE) ×2
SUT VIC AB 3-0 SH 27XBRD (SUTURE) ×1 IMPLANT
SYR BULB 3OZ (MISCELLANEOUS) ×2 IMPLANT
SYR CONTROL 10ML LL (SYRINGE) ×2 IMPLANT
TOWEL OR 17X24 6PK STRL BLUE (TOWEL DISPOSABLE) ×2 IMPLANT
TOWEL OR 17X26 10 PK STRL BLUE (TOWEL DISPOSABLE) ×2 IMPLANT
WATER STERILE IRR 1000ML POUR (IV SOLUTION) IMPLANT

## 2011-12-25 NOTE — Transfer of Care (Signed)
Immediate Anesthesia Transfer of Care Note  Patient: Tony Parsons  Procedure(s) Performed: Procedure(s) (LRB) with comments: HERNIA REPAIR INGUINAL ADULT (Right) INSERTION OF MESH (Right)  Patient Location: PACU  Anesthesia Type: General  Level of Consciousness: awake, alert  and oriented  Airway & Oxygen Therapy: Patient Spontanous Breathing and Patient connected to nasal cannula oxygen  Post-op Assessment: Report given to PACU RN and Post -op Vital signs reviewed and stable  Post vital signs: Reviewed and stable  Complications: No apparent anesthesia complications

## 2011-12-25 NOTE — Preoperative (Signed)
Beta Blockers   Reason not to administer Beta Blockers:Not Applicable 

## 2011-12-25 NOTE — Anesthesia Postprocedure Evaluation (Signed)
Anesthesia Post Note  Patient: Tony Parsons  Procedure(s) Performed: Procedure(s) (LRB): HERNIA REPAIR INGUINAL ADULT (Right) INSERTION OF MESH (Right)  Anesthesia type: General  Patient location: PACU  Post pain: Pain level controlled and Adequate analgesia  Post assessment: Post-op Vital signs reviewed, Patient's Cardiovascular Status Stable, Respiratory Function Stable, Patent Airway and Pain level controlled  Last Vitals:  Filed Vitals:   12/25/11 1345  BP:   Pulse: 77  Temp:   Resp: 16    Post vital signs: Reviewed and stable  Level of consciousness: awake, alert  and oriented  Complications: No apparent anesthesia complications

## 2011-12-25 NOTE — H&P (Signed)
Tony Parsons  Description:  26 year old male  11/21/2011 9:10 AM Office Visit Provider:  Robyne Askew, MD  MRN: [id number] Department:  [facility]            Diagnoses  Reason for Visit    Right inguinal hernia - Primary  Inguinal Hernia   550.90  new pt- eval RIH           Vitals - Last Recorded       BP  Pulse  Temp  Resp  Ht  Wt    120/72  60  97.8 F (36.6 C) (Temporal)  14  5\' 10"  (1.778 m)  149 lb 3.2 oz (67.677 kg)          BMI               21.41 kg/m2                   Progress Notes     Robyne Askew, MD 11/21/2011 9:23 AM Signed    Subjective:    Patient ID: Tony Parsons, male DOB: 25-Aug-1985, 26 y.o. MRN: [id number]  HPI  We are asked to see the patient in consultation by Dr. Nathanial Rancher to evaluate him for a right inguinal hernia. The patient is a 26 year old white male who has been noticing a bulge in his right groin for the last 3 or 4 weeks. He is also felt pain in his right lower quadrant. He felt the pain was similar to his kidney stone pain so he went to the emergency department where they felt the inguinal hernia. He has had a couple episodes of nausea and vomiting. His bowels are moving regularly.  Review of Systems  Constitutional: Negative.  HENT: Negative.  Eyes: Negative.  Respiratory: Negative.  Cardiovascular: Negative.  Gastrointestinal: Negative.  Genitourinary: Negative.  Musculoskeletal: Negative.  Skin: Negative.  Neurological: Negative.  Hematological: Negative.  Psychiatric/Behavioral: Negative.      Objective:    Physical Exam  Constitutional: He is oriented to person, place, and time. He appears well-developed and well-nourished.  HENT:  Head: Normocephalic and atraumatic.  Eyes: Conjunctivae and EOM are normal. Pupils are equal, round, and reactive to light.  Neck: Normal range of motion. Neck supple.  Cardiovascular: Normal rate, regular rhythm and normal heart sounds.  Pulmonary/Chest:  Effort normal and breath sounds normal.  Abdominal: Soft. Bowel sounds are normal.  Genitourinary:  There is a small reducible right inguinal hernia that is symptomatic. No palpable bulge or impulse with straining in the left groin.  Musculoskeletal: Normal range of motion.  Neurological: He is alert and oriented to person, place, and time.  Skin: Skin is warm and dry.  Psychiatric: He has a normal mood and affect. His behavior is normal.      Assessment:     The patient has a symptomatic small right inguinal hernia. Because of the risk of incarceration and strangulation I think he would benefit from having this fixed. I have discussed with him in detail the risks and benefits of the operation a fixed hernia as well as some of the technical aspects including the use of mesh and the risk of injury to the vas deferens and testicular artery and he understands and wishes to proceed     Plan:     Plan for right inguinal hernia repair with mesh  Not recorded                  Discontinued Medications         Reason for Discontinue    CRANBERRY PO  Error    Tamsulosin HCl (FLOMAX) 0.4 MG CAPS  Error                 Patient Instructions     Plan for right inguinal hernia repair with mesh          Level of Service     PR OFFICE CONSULTATION NEW/ESTAB PATIENT 40 MIN [91478]           All Flowsheet Templates (all recorded)     Encounter Vitals Flowsheet   Custom Formula Data Flowsheet   Anthropometrics Flowsheet                           Referring Provider          Ailene Ravel, MD            All Charges for This Encounter       Code  Description  Service Date  Service Provider  Modifiers  Quantity    [phone]  PR OFFICE CONSULTATION NEW/ESTAB PATIENT 40 MIN  11/21/2011  Robyne Askew, MD   1                Other Encounter Related Information     Allergies & Medications      Problem List       History      Patient-Entered Questionnaires      Printed AVS Reports       Printed at    Printed by    11/21/2011 9:20 AM  After Visit Summary  Robyne Askew, MD           No data filed

## 2011-12-25 NOTE — Telephone Encounter (Signed)
Patient notified of ultram 50 mg prescription sent electronically to [facility] in [place].  Ultram 50 mg, take 1 po, q6hrs prn pain, #50 with 1 refill.

## 2011-12-25 NOTE — Progress Notes (Signed)
Pt. Reports that he has itching & restlessness /w Hydrocodone. Call to Dr. Carolynne Edouard, requested other pain medicine so pt. Will rest this p.m.

## 2011-12-25 NOTE — Interval H&P Note (Signed)
History and Physical Interval Note:  12/25/2011 10:42 AM  Tony Parsons  has presented today for surgery, with the diagnosis of Right inguinal hernia  The various methods of treatment have been discussed with the patient and family. After consideration of risks, benefits and other options for treatment, the patient has consented to  Procedure(s) (LRB) with comments: HERNIA REPAIR INGUINAL ADULT (Right) INSERTION OF MESH (Right) as a surgical intervention .  The patient's history has been reviewed, patient examined, no change in status, stable for surgery.  I have reviewed the patient's chart and labs.  Questions were answered to the patient's satisfaction.     [name],[name]

## 2011-12-25 NOTE — Op Note (Signed)
12/25/2011  12:04 PM  PATIENT:  Tony Parsons  26 y.o. male  PRE-OPERATIVE DIAGNOSIS:  Right inguinal hernia  POST-OPERATIVE DIAGNOSIS:  Right Inguinal Hernia  PROCEDURE:  Procedure(s) (LRB) with comments: HERNIA REPAIR INGUINAL ADULT (Right) INSERTION OF MESH (Right)  SURGEON:  Surgeon(s) and Role:    * Robyne Askew, MD - Primary  PHYSICIAN ASSISTANT:   ASSISTANTS: none   ANESTHESIA:   general  EBL:     BLOOD ADMINISTERED:none  DRAINS: none   LOCAL MEDICATIONS USED:  MARCAINE     SPECIMEN:  No Specimen  DISPOSITION OF SPECIMEN:  N/A  COUNTS:  YES  TOURNIQUET:  * No tourniquets in log *  DICTATION: .Dragon Dictation After informed consent was obtained the patient was brought to the operating room and placed in the supine position on the operating table. After adequate induction of general anesthesia the patient's abdomen and right groin were prepped with ChloraPrep, allowed to dry, and draped in usual sterile manner. The right groin was then infiltrated with quarter percent Marcaine. A small incision was made with a 15 blade knife and the edge of the pubic tubercle on the right towards the anterior superior iliac spine. The incision was carried through the skin and subcutaneous tissue sharply with electrocautery until the fascia of the external oblique was encountered. A small bridging vein was clamped with hemostats divided and ligated with 3-0 silk ties. The external oblique fascia was opened along its fibers towards the apex of the external ring with a 15 blade knife and Metzenbaum scissors. A wheat Lander retractor was deployed. Blunt dissection was carried out of the cord structures until it could be surrounded between 2 fingers. A half-inch Penrose drain was placed around the cord structures for retraction purposes. The cord structures were gently skeletonized by blunt hemostat dissection and no hernia sac was identified with the cord. There was a palpable defect along  the floor of the canal medial to the cord with a small bulging piece of fat. This was reduced easily and the defect was repaired with a 0 Vicryl figure-of-eight stitch. Next a 3 x 6 piece of ultrapro mesh was chosen and cut to fit. The mesh was sewn inferiorly to the shelving edge of the inguinal ligament with an running 2-0 Prolene stitch. Superiorly the mesh was sewed to the musculoaponeurotic strength layer of the transversalis with interrupted 2-0 Prolene vertical mattress stitches. Tails were cut in the mesh laterally. The tails were wrapped around the cord structures. The tails of the mesh were anchored to the shelving edge of the inguinal ligament with an interrupted 2-0 Prolene stitch. Once this was accomplished the hernia appeared to be well repaired and the mesh was in good position. During the dissection the ilioinguinal nerve was identified and involved with some scar tissue. It was dissected free along its length in the canal and clamped proximally and distally with hemostats divided and ligated with 3-0 silk ties. The wound was then irrigated copious amounts of saline. The external oblique fascia was reapproximated with a running 2-0 Vicryl stitch. The wound was then infiltrated with more cortisone Marcaine. The subcutaneous fascia was closed with a running 3-0 Vicryl stitch. The skin was closed with a running 4 Monocryl subcuticular stitch. Dermabond dressings were applied. The patient tolerated the procedure well. At the end of the case needle sponge and instrument counts were correct. The patient was then awakened and taken to recovery in stable condition. The patient's testicles in the scrotum  at the end of the case.  PLAN OF CARE: Discharge to home after PACU  PATIENT DISPOSITION:  PACU - hemodynamically stable.   Delay start of Pharmacological VTE agent (>24hrs) due to surgical blood loss or risk of bleeding: not applicable

## 2011-12-25 NOTE — Anesthesia Preprocedure Evaluation (Signed)
Anesthesia Evaluation  Patient identified by MRN, date of birth, ID band Patient awake    Reviewed: Allergy & Precautions, H&P , NPO status , Patient's Chart, lab work & pertinent test results, reviewed documented beta blocker date and time   Airway Mallampati: II TM Distance: >3 FB Neck ROM: full    Dental   Pulmonary neg pulmonary ROS,  breath sounds clear to auscultation        Cardiovascular negative cardio ROS  Rhythm:regular     Neuro/Psych PSYCHIATRIC DISORDERS negative neurological ROS     GI/Hepatic negative GI ROS, Neg liver ROS,   Endo/Other  negative endocrine ROS  Renal/GU negative Renal ROS  negative genitourinary   Musculoskeletal   Abdominal   Peds  Hematology negative hematology ROS (+)   Anesthesia Other Findings See surgeon's H&P   Reproductive/Obstetrics negative OB ROS                           Anesthesia Physical Anesthesia Plan  ASA: II  Anesthesia Plan: General   Post-op Pain Management:    Induction: Intravenous  Airway Management Planned: LMA  Additional Equipment:   Intra-op Plan:   Post-operative Plan: Extubation in OR  Informed Consent: I have reviewed the patients History and Physical, chart, labs and discussed the procedure including the risks, benefits and alternatives for the proposed anesthesia with the patient or authorized representative who has indicated his/her understanding and acceptance.   Dental Advisory Given  Plan Discussed with: CRNA and Surgeon  Anesthesia Plan Comments:         Anesthesia Quick Evaluation  

## 2011-12-25 NOTE — Progress Notes (Signed)
Shortly after arriving in [facility], asking for pain med., reports pain score 4.   Call to Dr. Carolynne Edouard , in OR, requested order.  Pain med. Issued to pt.

## 2011-12-25 NOTE — Anesthesia Procedure Notes (Signed)
Procedure Name: LMA Insertion Date/Time: 12/25/2011 10:54 AM Performed by: Margaree Mackintosh Pre-anesthesia Checklist: Patient identified, Timeout performed, Emergency Drugs available, Suction available and Patient being monitored Patient Re-evaluated:Patient Re-evaluated prior to inductionOxygen Delivery Method: Circle system utilized Preoxygenation: Pre-oxygenation with 100% oxygen Intubation Type: IV induction LMA: LMA inserted LMA Size: 4.0 Number of attempts: 1 Placement Confirmation: positive ETCO2 and breath sounds checked- equal and bilateral Tube secured with: Tape Dental Injury: Teeth and Oropharynx as per pre-operative assessment

## 2011-12-26 ENCOUNTER — Telehealth (INDEPENDENT_AMBULATORY_CARE_PROVIDER_SITE_OTHER): Payer: Self-pay

## 2011-12-26 NOTE — Telephone Encounter (Signed)
Pt states he had hernia repair yesterday. He states his discomfort is increasing at hernia site. Bandage dry and intact. Pt has not been using ice packs. Pt advised to take pain med as scheduled and begin ice packs on and off for the next 24 to 48 hours. Pt advised if this does not help with pain control to call our office for recommendations or possible medication change.

## 2011-12-27 ENCOUNTER — Encounter (HOSPITAL_COMMUNITY): Payer: Self-pay | Admitting: General Surgery

## 2012-01-09 ENCOUNTER — Encounter (INDEPENDENT_AMBULATORY_CARE_PROVIDER_SITE_OTHER): Payer: Self-pay | Admitting: General Surgery

## 2012-01-09 ENCOUNTER — Ambulatory Visit (INDEPENDENT_AMBULATORY_CARE_PROVIDER_SITE_OTHER): Payer: Medicaid Other | Admitting: General Surgery

## 2012-01-09 VITALS — BP 136/64 | HR 68 | Temp 97.9°F | Resp 16 | Ht 71.0 in | Wt 154.0 lb

## 2012-01-09 DIAGNOSIS — K4090 Unilateral inguinal hernia, without obstruction or gangrene, not specified as recurrent: Secondary | ICD-10-CM

## 2012-01-09 NOTE — Patient Instructions (Signed)
No heavy lifting for another 3 weeks 

## 2012-01-09 NOTE — Progress Notes (Signed)
Subjective:     Patient ID: Tony Parsons, male   DOB: [birth year], 26 y.o.   MRN: [id number]  HPI The patient is a 26 year old white male who is about 28 weeks out from a right inguinal hernia repair with mesh. He initially had a lot of soreness but this is gradually getting better. His appetite has been good and his bowels are working normally.  Review of Systems     Objective:   Physical Exam On exam his right inguinal incision is healing nicely with no sign of infection. His abdomen is soft and nontender. There is no palpable evidence for recurrence of the hernia    Assessment:     3 weeks status post right inguinal hernia with mesh    Plan:     At this point I would like him to continue to refrain from any strenuous activity. We will plan to see him back in 3 weeks at which time if he is doing well we will clear him for full activity

## 2012-01-31 ENCOUNTER — Encounter (INDEPENDENT_AMBULATORY_CARE_PROVIDER_SITE_OTHER): Payer: Medicaid Other | Admitting: General Surgery

## 2012-02-19 ENCOUNTER — Encounter (INDEPENDENT_AMBULATORY_CARE_PROVIDER_SITE_OTHER): Payer: Self-pay | Admitting: General Surgery

## 2012-02-19 ENCOUNTER — Ambulatory Visit (INDEPENDENT_AMBULATORY_CARE_PROVIDER_SITE_OTHER): Payer: Medicaid Other | Admitting: General Surgery

## 2012-02-19 VITALS — BP 132/88 | HR 86 | Temp 98.8°F | Ht 70.0 in | Wt 160.8 lb

## 2012-02-19 DIAGNOSIS — K4090 Unilateral inguinal hernia, without obstruction or gangrene, not specified as recurrent: Secondary | ICD-10-CM

## 2012-02-19 NOTE — Progress Notes (Signed)
Subjective:     Patient ID: Tony Parsons, male   DOB: 04/14/1985, 26 y.o.   MRN: [id number]  HPI The patient is a 26 year old white male who is about 6 weeks status post right inguinal hernia repair with mesh. He is doing well and has no complaints today. He denies any abdominal pain. His appetite is good and his bowels are working normally.  Review of Systems     Objective:   Physical Exam On exam his abdomen is soft and nontender. His right groin incision is healing nicely with no sign of infection. There is no palpable evidence for recurrence of the hernia.    Assessment:     6 weeks status post right inguinal hernia repair with mesh    Plan:     At this point I believe he can return all his normal activities without any restrictions. We will plan to see him back on a when necessary basis

## 2012-02-19 NOTE — Patient Instructions (Signed)
May return to all normal activities 

## 2015-06-15 ENCOUNTER — Emergency Department: Payer: BLUE CROSS/BLUE SHIELD

## 2015-06-15 ENCOUNTER — Telehealth: Payer: Self-pay

## 2015-06-15 ENCOUNTER — Encounter: Payer: Self-pay | Admitting: Emergency Medicine

## 2015-06-15 ENCOUNTER — Emergency Department
Admission: EM | Admit: 2015-06-15 | Discharge: 2015-06-15 | Disposition: A | Discharge status: Home / Self Care | Payer: BLUE CROSS/BLUE SHIELD | Attending: Emergency Medicine | Admitting: Emergency Medicine

## 2015-06-15 DIAGNOSIS — R1011 Right upper quadrant pain: Secondary | ICD-10-CM | POA: Diagnosis not present

## 2015-06-15 DIAGNOSIS — F329 Major depressive disorder, single episode, unspecified: Secondary | ICD-10-CM | POA: Insufficient documentation

## 2015-06-15 DIAGNOSIS — R079 Chest pain, unspecified: Secondary | ICD-10-CM | POA: Diagnosis not present

## 2015-06-15 DIAGNOSIS — N200 Calculus of kidney: Secondary | ICD-10-CM | POA: Insufficient documentation

## 2015-06-15 DIAGNOSIS — R1010 Upper abdominal pain, unspecified: Secondary | ICD-10-CM

## 2015-06-15 LAB — COMPREHENSIVE METABOLIC PANEL
ALBUMIN: 4.9 g/dL (ref 3.5–5.0)
ALT: 32 U/L (ref 17–63)
AST: 26 U/L (ref 15–41)
Alkaline Phosphatase: 78 U/L (ref 38–126)
Anion gap: 7 (ref 5–15)
BILIRUBIN TOTAL: 0.7 mg/dL (ref 0.3–1.2)
BUN: 9 mg/dL (ref 6–20)
CO2: 24 mmol/L (ref 22–32)
Calcium: 9.9 mg/dL (ref 8.9–10.3)
Chloride: 104 mmol/L (ref 101–111)
Creatinine, Ser: 0.9 mg/dL (ref 0.61–1.24)
GFR calc Af Amer: >60 mL/min (ref >60)
GFR calc non Af Amer: >60 mL/min (ref >60)
GLUCOSE: 105 mg/dL — AB (ref 65–99)
POTASSIUM: 3.7 mmol/L (ref 3.5–5.1)
Sodium: 135 mmol/L (ref 135–145)
TOTAL PROTEIN: 7.5 g/dL (ref 6.5–8.1)

## 2015-06-15 LAB — URINALYSIS COMPLETE WITH MICROSCOPIC (ARMC ONLY)
BACTERIA UA: NONE SEEN
Bilirubin Urine: NEGATIVE
Glucose, UA: NEGATIVE mg/dL
HGB URINE DIPSTICK: NEGATIVE
KETONES UR: NEGATIVE mg/dL
LEUKOCYTES UA: NEGATIVE
NITRITE: NEGATIVE
PH: 8 (ref 5.0–8.0)
PROTEIN: NEGATIVE mg/dL
SPECIFIC GRAVITY, URINE: 1.018 (ref 1.005–1.030)
Squamous Epithelial / LPF: NONE SEEN

## 2015-06-15 LAB — CBC
HEMATOCRIT: 47.7 % (ref 40.0–52.0)
HEMOGLOBIN: 16.4 g/dL (ref 13.0–18.0)
MCH: 30.2 pg (ref 26.0–34.0)
MCHC: 34.3 g/dL (ref 32.0–36.0)
MCV: 88.1 fL (ref 80.0–100.0)
Platelets: 236 10*3/uL (ref 150–440)
RBC: 5.41 MIL/uL (ref 4.40–5.90)
RDW: 13.3 % (ref 11.5–14.5)
WBC: 9.7 10*3/uL (ref 3.8–10.6)

## 2015-06-15 LAB — LIPASE, BLOOD: Lipase: 15 U/L (ref 11–51)

## 2015-06-15 LAB — TROPONIN I

## 2015-06-15 MED ORDER — GI COCKTAIL ~~LOC~~
30.0000 mL | Freq: Once | ORAL | Status: AC
  Administered 2015-06-15: 30 mL via ORAL
  Filled 2015-06-15: qty 30

## 2015-06-15 MED ORDER — ONDANSETRON HCL 4 MG PO TABS: 4.0000 mg | ORAL_TABLET | Freq: Every day | ORAL | Status: DC | PRN

## 2015-06-15 MED ORDER — TRAMADOL HCL 50 MG PO TABS: 50.0000 mg | ORAL_TABLET | Freq: Four times a day (QID) | ORAL | Status: DC | PRN

## 2015-06-15 MED ORDER — SUCRALFATE 1 G PO TABS: 1.0000 g | ORAL_TABLET | Freq: Four times a day (QID) | ORAL | Status: DC

## 2015-06-15 NOTE — ED Provider Notes (Signed)
[facility] Emergency Department Provider Note  ____________________________________________    I have reviewed the triage vital signs and the nursing notes.   HISTORY  Chief Complaint Chest Pain and Abdominal Pain    HPI Tony Parsons is a 30 y.o. male who presents with epigastric pain which has been on and off over the last 4 months. He reports more recently he has been feeling nauseous as well. He denies fevers or chills. Occasionally he has burning discomfort in his chest. His PCP put him on Prilosec but he reports this has not helped. No recent travel. No calf pain. No leg swelling. No shortness of breath. No cough.     Past Medical History  Diagnosis Date  . Kidney stone 08/2011  . Depression   . Anxiety   . Inguinal hernia     Patient Active Problem List   Diagnosis Date Noted  . Right inguinal hernia 11/21/2011    Past Surgical History  Procedure Laterality Date  . Wisdom tooth extraction  2005  . Upper gastrointestinal endoscopy  1999  . Inguinal hernia repair  12/25/2011    Procedure: HERNIA REPAIR INGUINAL ADULT;  Surgeon: Robyne Askew, MD;  Location: [facility] OR;  Service: General;  Laterality: Right;  . Hernia repair  12/25/11    RIH repair    Current Outpatient Rx  Name  Route  Sig  Dispense  Refill  . escitalopram (LEXAPRO) 10 MG tablet   Oral   Take 10 mg by mouth daily.         Marland Kitchen ketorolac (TORADOL) 10 MG tablet   Oral   Take 10 mg by mouth every 6 (six) hours as needed.         . ondansetron (ZOFRAN) 4 MG tablet   Oral   Take 1 tablet (4 mg total) by mouth daily as needed for nausea or vomiting.   20 tablet   1   . sucralfate (CARAFATE) 1 g tablet   Oral   Take 1 tablet (1 g total) by mouth 4 (four) times daily.   60 tablet   1   . traMADol (ULTRAM) 50 MG tablet   Oral   Take 1 tablet (50 mg total) by mouth every 6 (six) hours as needed.   20 tablet   0     Allergies Codeine and Oxycodone  Family  History  Problem Relation Age of Onset  . Diabetes Mother   . Hypertension Father   . Arthritis Father     Social History Social History  Substance Use Topics  . Smoking status: Never Smoker   . Smokeless tobacco: None  . Alcohol Use: Yes     Comment: Occassional Use    Review of Systems  Constitutional: Negative for fever.  ENT: Negative for sore throat Cardiovascular: Negative for chest pain  Respiratory: Negative for shortness of breath.No cough Gastrointestinal: As above  Musculoskeletal: Negative for back pain. Skin: Negative for rash. Neurological: Negative for headache Psychiatric: no anxiety    ____________________________________________   PHYSICAL EXAM:  VITAL SIGNS: ED Triage Vitals  Enc Vitals Group     BP 06/15/15 0949 148/96 mmHg     Pulse Rate 06/15/15 0949 94     Resp 06/15/15 0949 16     Temp 06/15/15 0949 98.4 F (36.9 C)     Temp Source 06/15/15 0949 Oral     SpO2 06/15/15 0949 99 %     Weight 06/15/15 0949 175 lb (79.379  kg)     Height 06/15/15 0949 5\' 11"  (1.803 m)     Head Cir --      Peak Flow --      Pain Score 06/15/15 0950 8     Pain Loc --      Pain Edu? --      Excl. in GC? --      Constitutional: Alert and oriented. Well appearing and in no distress.  Eyes: Conjunctivae are normal. No erythema or injection ENT   Head: Normocephalic and atraumatic.   Mouth/Throat: Mucous membranes are moist. Cardiovascular: Normal rate, regular rhythm. Normal and symmetric distal pulses are present in the upper extremities.  Respiratory: Normal respiratory effort without tachypnea nor retractions. Gastrointestinal: Soft and non-tender in all quadrants. No distention. There is no CVA tenderness. Genitourinary: deferred Musculoskeletal: Nontender with normal range of motion in all extremities.  Neurologic:  Normal speech and language. No gross focal neurologic deficits are appreciated. Skin:  Skin is warm, dry and intact. No rash  noted. Psychiatric: Mood and affect are normal. Patient exhibits appropriate insight and judgment.  ____________________________________________    LABS (pertinent positives/negatives)  Labs Reviewed  COMPREHENSIVE METABOLIC PANEL - Abnormal; Notable for the following:    Glucose, Bld 105 (*)    All other components within normal limits  URINALYSIS COMPLETEWITH MICROSCOPIC ([facility] ONLY) - Abnormal; Notable for the following:    Color, Urine YELLOW (*)    APPearance CLEAR (*)    All other components within normal limits  LIPASE, BLOOD  CBC  TROPONIN I    ____________________________________________   EKG ED ECG REPORT I, 08/15/15, the attending physician, personally viewed and interpreted this ECG.  Date: 06/15/2015 EKG Time: 953 Rate: 91 Rhythm: normal sinus rhythm QRS Axis: normal Intervals: normal ST/T Wave abnormalities: normal Conduction Disturbances: none Narrative Interpretation: unremarkable    ____________________________________________    RADIOLOGY  Chest x-ray is unremarkable  Ultrasound shows nonspecific gallbladder wall thickening  ____________________________________________   PROCEDURES  Procedure(s) performed: none  Critical Care performed: none  ____________________________________________   INITIAL IMPRESSION / ASSESSMENT AND PLAN / ED COURSE  Pertinent labs & imaging results that were available during my care of the patient were reviewed by me and considered in my medical decision making (see chart for details).  Patient presents with several months of worsening abdominal discomfort in epigastrium. Differential diagnosis includes gastritis/GERD/PUD/cholecystitis/pancreatitis. Lipase is normal. Ultrasound shows nonspecific thickening the patient has no tenderness over the gallbladder. I'm suspicious of PUD and communicated as such to the patient and his wife. The patient is comfortable in the emergency department and in no acute  distress. His exam is unremarkable. His lab work is reassuring  They are very insistent that the gallbladder be removed today. I explained that do not see an indication for that but I did speak with Dr. 08/15/2015 of surgery who will see the patient in his office this week.  I also suggested follow up with GI as EGD is likely necessary.  Again seemed quite disappointed that we could not take him to the operating room today I tried to explain to them as best as I could that we do not have a clear indication to do that emergently and hence outpatient follow-up is appropriate  ____________________________________________   FINAL CLINICAL IMPRESSION(S) / ED DIAGNOSES  Final diagnoses:  Pain of upper abdomen          Everlene Farrier, MD 06/15/15 1510

## 2015-06-15 NOTE — ED Notes (Signed)
Pt to ed with c/o upper abd pain and chest pain, right arm pain.  Pt also reports nausea and vomiting in the am only, weakness and pain in between shoulder blades, and back pain.  Pt states symptoms have been going on for 4 months.  Seen by PMD started on prilosec but states " I don't think that is what is going on"

## 2015-06-15 NOTE — ED Notes (Signed)
Patient is resting comfortably. 

## 2015-06-15 NOTE — Telephone Encounter (Signed)
Received call from Dr. Everlene Farrier who would like for Tony Parsons to be seen as soon as possible in office for symptomatic cholelithasis.  Tony Parsons placed on schedule to see Dr. Orvis Brill tomorrow at 1pm.  Tony Parsons was called and given appointment information. He confirmed appointment.

## 2015-06-15 NOTE — Discharge Instructions (Signed)

## 2015-06-16 ENCOUNTER — Ambulatory Visit (INDEPENDENT_AMBULATORY_CARE_PROVIDER_SITE_OTHER): Payer: BLUE CROSS/BLUE SHIELD | Admitting: Surgery

## 2015-06-16 ENCOUNTER — Encounter: Payer: Self-pay | Admitting: Surgery

## 2015-06-16 VITALS — BP 134/85 | HR 66 | Temp 97.9°F | Wt 169.8 lb

## 2015-06-16 DIAGNOSIS — R1013 Epigastric pain: Secondary | ICD-10-CM

## 2015-06-16 NOTE — Patient Instructions (Signed)
Your HIDA scan is scheduled for 11am on Friday 06/18/15. Please arrive at the [facility] at [facility] at 1045am. See testing sheet for more information.  If this test is negative, we will get you scheduled for an EGD with Dr. Servando Snare and proceed from there.

## 2015-06-16 NOTE — Progress Notes (Signed)
Subjective:     Patient ID: Tony Parsons, male   DOB: [birth year], 30 y.o.   MRN: [id number]  HPI  30 year old male with a past medical history of kidney stones and inguinal hernia as well as nausea and GI issues as a child comes in today complaining of 4 months of epigastric pain that radiates to his right side as well as his right anterior shoulder and occasionally the pain radiates down his arm. Patient states that every morning for 4 months he has had nausea and had to vomit bile. Patient states that the pain that he gets is worse anytime after he eats. And patient states that usually after eating breakfast he has to vomit up what he eats and vomits up several more times until is just bile coming up. Patient states that occasionally he will have diarrhea as well and has noted that he has some lighter stools but has not noticed any dark or tarry stools. Patient does not have anyone in the family with any irritable bowel Crohn's disease or ulcerative colitis that he knows of he does have a fairly significant family history of having gallstones and having to have the gallbladder removed and his mother and other siblings as well.  Patient is unsure what type of GI issues she had as a child but he did have nausea then but is unsure what they did to make this improves. Patient has had PPI as well as Carafate ordered for him patient states that he's tried this before and that doesn't seem to help with the pain. Patient does not have a current gastroenterologist that he has been seeing,  and he knows he had that EGD in 1999 however unsure of what the results were. Patient's girlfriend is fairly insistent that this is his gallbladder and that she had a very similar situation in the past and expressed her concerns.   Past Medical History  Diagnosis Date  . Kidney stone 08/2011  . Depression   . Anxiety   . Inguinal hernia    Past Surgical History  Procedure Laterality Date  . Wisdom tooth extraction  2005  .  Upper gastrointestinal endoscopy  1999  . Inguinal hernia repair  12/25/2011    Procedure: HERNIA REPAIR INGUINAL ADULT;  Surgeon: Robyne Askew, MD;  Location: [facility] OR;  Service: General;  Laterality: Right;  . Hernia repair  12/25/11    RIH repair   Family History  Problem Relation Age of Onset  . Diabetes Mother   . Hypertension Father   . Arthritis Father   Melanoma: sister and grandmother   Social History   Social History  . Marital Status: Single    Spouse Name: N/A  . Number of Children: N/A  . Years of Education: N/A   Social History Main Topics  . Smoking status: Never Smoker   . Smokeless tobacco: None  . Alcohol Use: Yes     Comment: Occassional Use  . Drug Use: No  . Sexual Activity: Yes   Other Topics Concern  . None   Social History Narrative    Current outpatient prescriptions:  .  sucralfate (CARAFATE) 1 g tablet, Take 1 tablet (1 g total) by mouth 4 (four) times daily., Disp: 60 tablet, Rfl: 1 .  traMADol (ULTRAM) 50 MG tablet, Take 1 tablet (50 mg total) by mouth every 6 (six) hours as needed., Disp: 20 tablet, Rfl: 0 Allergies  Allergen Reactions  . Codeine Itching  . Oxycodone Other (See  Comments)    Keeps pt up all night      Review of Systems  Constitutional: Positive for appetite change. Negative for fever, chills, activity change, fatigue and unexpected weight change.  HENT: Negative for congestion and sinus pressure.   Respiratory: Negative for chest tightness, wheezing and stridor.   Cardiovascular: Negative for chest pain, palpitations and leg swelling.  Gastrointestinal: Positive for nausea, vomiting, abdominal pain, diarrhea and abdominal distention. Negative for constipation, blood in stool, anal bleeding and rectal pain.  Genitourinary: Negative for dysuria and hematuria.  Musculoskeletal: Positive for back pain. Negative for arthralgias and neck pain.  Skin: Negative for color change, pallor, rash and wound.  Neurological:  Negative for dizziness and weakness.  Hematological: Negative for adenopathy. Does not bruise/bleed easily.  Psychiatric/Behavioral: Negative for agitation. The patient is not nervous/anxious.   All other systems reviewed and are negative.      Filed Vitals:   06/16/15 1330  BP: 134/85  Pulse: 66  Temp: 97.9 F (36.6 C)    Objective:   Physical Exam  Constitutional: He is oriented to person, place, and time. He appears well-developed and well-nourished. No distress.  HENT:  Head: Normocephalic and atraumatic.  Right Ear: External ear normal.  Left Ear: External ear normal.  Nose: Nose normal.  Mouth/Throat: Oropharynx is clear and moist. No oropharyngeal exudate.  Eyes: Conjunctivae and EOM are normal. Pupils are equal, round, and reactive to light. No scleral icterus.  Neck: Normal range of motion. Neck supple. No tracheal deviation present.  Cardiovascular: Normal rate, regular rhythm, normal heart sounds and intact distal pulses.  Exam reveals no gallop and no friction rub.   No murmur heard. Pulmonary/Chest: Effort normal and breath sounds normal. No respiratory distress. He has no wheezes. He has no rales. He exhibits no tenderness.  Abdominal: Soft. Bowel sounds are normal. He exhibits no distension and no mass. There is tenderness. There is no rebound and no guarding.  Epigastric pain, no other pain in the abdomen, negative Murphy's sign, well healed mole biopsy site in the right upper quadrant.  Minimal right CVA tenderness  Musculoskeletal: Normal range of motion. He exhibits tenderness. He exhibits no edema.  Bilateral lower lumbar back tenderness to mild palpation hurts more with movement.  Neurological: He is alert and oriented to person, place, and time. No cranial nerve deficit.  Skin: Skin is warm and dry. No rash noted. No erythema. No pallor.  Psychiatric: He has a normal mood and affect. His behavior is normal. Judgment and thought content normal.  Vitals  reviewed.      CBC Latest Ref Rng 06/15/2015 12/18/2011  WBC 3.8 - 10.6 K/uL 9.7 7.1  Hemoglobin 13.0 - 18.0 g/dL 09.8 11.9  Hematocrit 14.7 - 52.0 % 47.7 46.5  Platelets 150 - 440 K/uL 236 237    CMP Latest Ref Rng 06/15/2015  Glucose 65 - 99 mg/dL 829(F)  BUN 6 - 20 mg/dL 9  Creatinine 6.21 - 3.08 mg/dL 6.57  Sodium 846 - 962 mmol/L 135  Potassium 3.5 - 5.1 mmol/L 3.7  Chloride 101 - 111 mmol/L 104  CO2 22 - 32 mmol/L 24  Calcium 8.9 - 10.3 mg/dL 9.9  Total Protein 6.5 - 8.1 g/dL 7.5  Total Bilirubin 0.3 - 1.2 mg/dL 0.7  Alkaline Phos 38 - 126 U/L 78  AST 15 - 41 U/L 26  ALT 17 - 63 U/L 32    U/S:  Small gallstone versus sludge ball. Nonspecific gallbladder wall thickening. Diffuse  hepatic steatosis.  Assessment:     30 year old male with epigastric pain    Plan:     Personally reviewed the patient's past medical history including his biopsies of nevus at [facility] in 2008 and right inguinal hernia repair back in 2013. I have also personally reviewed his laboratory values that did not show any elevation in white blood cell count or liver enzymes. I have personally reviewed his ultrasound images which do not show any frank gallstones there is a question of one area which may be sludge versus a polyp but is small and in the dome of the gallbladder. I also measured the gallbladder wall thickness to be 4.5 mm and not 8 mm as on the radiology report. I also reviewed the radiology read as above.  I discussed with the patient that he does not have the typical symptoms for straightforward symptomatic cholelithiasis. I discussed with him that constant nausea every a.m. and pain with eating and pain and nausea with eating cold liquids may be more consistent with gastritis or peptic ulcer disease them with gallbladder disease. I discussed with him that given the results of the ultrasound and his unusual symptoms and not entirely sure that this is from symptomatic cholelithiasis.   I am  recommending that a HIDA scan with CCK be done to examine the functioning of the gallbladder. I discussed with the patient that if this were positive the would be able to proceed with a laparoscopic cholecystectomy with about a 95% certainty that it would benefit him. However without a positive HIDA would be quite concerned that this is not the gallbladder and would then refer him to GI for an EGD. I did discuss with the patient that if both the HIDA scan and the EGD were negative, I would still be willing to remove the gallbladder, with the caveat of him understanding that only about 35% of those patients get any benefit from a cholecystectomy.    Patient is okay with the plan however significant other visibly upset by this. They did attempt to convince me to go straight to the operation however I explained that I would not recommend this. Patient was okay with the plan and agreement with such. We'll schedule HIDA for later this week.

## 2015-06-18 ENCOUNTER — Ambulatory Visit
Admission: RE | Admit: 2015-06-18 | Discharge: 2015-06-18 | Disposition: A | Discharge status: Home / Self Care | Payer: BLUE CROSS/BLUE SHIELD | Source: Ambulatory Visit | Attending: Surgery | Admitting: Surgery

## 2015-06-18 DIAGNOSIS — R1013 Epigastric pain: Secondary | ICD-10-CM | POA: Insufficient documentation

## 2015-06-18 MED ORDER — SINCALIDE 5 MCG IJ SOLR
0.0200 ug/kg | Freq: Once | INTRAMUSCULAR | Status: AC
  Administered 2015-06-18: 1.53 ug via INTRAVENOUS

## 2015-06-18 MED ORDER — TECHNETIUM TC 99M MEBROFENIN IV KIT
5.0000 | PACK | Freq: Once | INTRAVENOUS | Status: AC | PRN
  Administered 2015-06-18: 5.2 via INTRAVENOUS

## 2015-06-21 ENCOUNTER — Encounter: Payer: Self-pay | Admitting: *Deleted

## 2015-06-21 ENCOUNTER — Telehealth: Payer: Self-pay | Admitting: Surgery

## 2015-06-21 ENCOUNTER — Other Ambulatory Visit: Payer: Self-pay

## 2015-06-21 DIAGNOSIS — G8929 Other chronic pain: Secondary | ICD-10-CM

## 2015-06-21 DIAGNOSIS — R1011 Right upper quadrant pain: Principal | ICD-10-CM

## 2015-06-21 NOTE — Telephone Encounter (Signed)
Patient has called and would like the results to his HIDA scan that was done on 06/18/15 ordered by Dr Orvis Brill. Phone number has been verified.

## 2015-06-21 NOTE — Telephone Encounter (Signed)
Returned phone call to patient at this time. Explained that Dr. Orvis Brill has reviewed HIDA scan and would like for patient to go for EGD prior to going ahead with Cholecystectomy. Patient is ok with this decision.  Spoke with Dr. Servando Snare and he would like to complete EGD on 06/24/15 at [facility]. Orders placed. Instructions printed and faxed to [facility] office for patient's wife to pick up this afternoon.

## 2015-06-23 NOTE — Discharge Instructions (Signed)

## 2015-06-24 ENCOUNTER — Encounter
Admission: RE | Disposition: A | Discharge status: Home / Self Care | Payer: Self-pay | Source: Ambulatory Visit | Attending: Gastroenterology

## 2015-06-24 ENCOUNTER — Ambulatory Visit
Admission: RE | Admit: 2015-06-24 | Discharge: 2015-06-24 | Disposition: A | Discharge status: Home / Self Care | Payer: BLUE CROSS/BLUE SHIELD | Source: Ambulatory Visit | Attending: Gastroenterology | Admitting: Gastroenterology

## 2015-06-24 ENCOUNTER — Ambulatory Visit: Payer: BLUE CROSS/BLUE SHIELD | Admitting: Student in an Organized Health Care Education/Training Program

## 2015-06-24 DIAGNOSIS — R1013 Epigastric pain: Secondary | ICD-10-CM | POA: Diagnosis present

## 2015-06-24 DIAGNOSIS — M069 Rheumatoid arthritis, unspecified: Secondary | ICD-10-CM | POA: Diagnosis not present

## 2015-06-24 DIAGNOSIS — F329 Major depressive disorder, single episode, unspecified: Secondary | ICD-10-CM | POA: Insufficient documentation

## 2015-06-24 DIAGNOSIS — K219 Gastro-esophageal reflux disease without esophagitis: Secondary | ICD-10-CM | POA: Insufficient documentation

## 2015-06-24 DIAGNOSIS — F419 Anxiety disorder, unspecified: Secondary | ICD-10-CM | POA: Insufficient documentation

## 2015-06-24 DIAGNOSIS — G43909 Migraine, unspecified, not intractable, without status migrainosus: Secondary | ICD-10-CM | POA: Diagnosis not present

## 2015-06-24 DIAGNOSIS — Z79899 Other long term (current) drug therapy: Secondary | ICD-10-CM | POA: Diagnosis not present

## 2015-06-24 HISTORY — DX: Headache: R51

## 2015-06-24 HISTORY — DX: Unspecified osteoarthritis, unspecified site: M19.90

## 2015-06-24 HISTORY — DX: Headache, unspecified: R51.9

## 2015-06-24 HISTORY — PX: ESOPHAGOGASTRODUODENOSCOPY (EGD) WITH PROPOFOL: SHX5813

## 2015-06-24 HISTORY — DX: Gastro-esophageal reflux disease without esophagitis: K21.9

## 2015-06-24 SURGERY — ESOPHAGOGASTRODUODENOSCOPY (EGD) WITH PROPOFOL
Anesthesia: Monitor Anesthesia Care

## 2015-06-24 MED ORDER — OXYCODONE HCL 5 MG/5ML PO SOLN
5.0000 mg | Freq: Once | ORAL | Status: DC | PRN
Start: 2015-06-24 — End: 2015-06-24

## 2015-06-24 MED ORDER — OXYCODONE HCL 5 MG PO TABS: 5.0000 mg | ORAL_TABLET | Freq: Once | ORAL | Status: DC | PRN

## 2015-06-24 MED ORDER — PROPOFOL 10 MG/ML IV BOLUS
INTRAVENOUS | Status: DC | PRN
  Administered 2015-06-24: 30 mg via INTRAVENOUS
  Administered 2015-06-24: 70 mg via INTRAVENOUS
  Administered 2015-06-24: 40 mg via INTRAVENOUS
  Administered 2015-06-24 (×2): 30 mg via INTRAVENOUS

## 2015-06-24 MED ORDER — LACTATED RINGERS IV SOLN
INTRAVENOUS | Status: DC
  Administered 2015-06-24: 10:00:00 via INTRAVENOUS

## 2015-06-24 MED ORDER — SODIUM CHLORIDE 0.9 % IV SOLN: INTRAVENOUS | Status: DC

## 2015-06-24 MED ORDER — GLYCOPYRROLATE 0.2 MG/ML IJ SOLN
INTRAMUSCULAR | Status: DC | PRN
  Administered 2015-06-24: 0.2 mg via INTRAVENOUS

## 2015-06-24 MED ORDER — LIDOCAINE HCL (CARDIAC) 20 MG/ML IV SOLN
INTRAVENOUS | Status: DC | PRN
  Administered 2015-06-24: 40 mg via INTRAVENOUS

## 2015-06-24 SURGICAL SUPPLY — 31 items
BALLN DILATOR 10-12 8 (BALLOONS)
BALLN DILATOR 12-15 8 (BALLOONS)
BALLN DILATOR 15-18 8 (BALLOONS)
BALLN DILATOR CRE 0-12 8 (BALLOONS)
BALLN DILATOR ESOPH 8 10 CRE (MISCELLANEOUS) IMPLANT
BALLOON DILATOR 12-15 8 (BALLOONS) IMPLANT
BALLOON DILATOR 15-18 8 (BALLOONS) IMPLANT
BALLOON DILATOR CRE 0-12 8 (BALLOONS) IMPLANT
BLOCK BITE 60FR ADLT L/F GRN (MISCELLANEOUS) ×3 IMPLANT
CANISTER SUCT 1200ML W/VALVE (MISCELLANEOUS) ×3 IMPLANT
CLIP HMST 235XBRD CATH ROT (MISCELLANEOUS) IMPLANT
CLIP RESOLUTION 360 11X235 (MISCELLANEOUS)
FCP ESCP3.2XJMB 240X2.8X (MISCELLANEOUS)
FORCEPS BIOP RAD 4 LRG CAP 4 (CUTTING FORCEPS) IMPLANT
FORCEPS BIOP RJ4 240 W/NDL (MISCELLANEOUS)
FORCEPS ESCP3.2XJMB 240X2.8X (MISCELLANEOUS) IMPLANT
GOWN CVR UNV OPN BCK APRN NK (MISCELLANEOUS) ×2 IMPLANT
GOWN ISOL THUMB LOOP REG UNIV (MISCELLANEOUS) ×6
INJECTOR VARIJECT VIN23 (MISCELLANEOUS) IMPLANT
KIT DEFENDO VALVE AND CONN (KITS) IMPLANT
KIT ENDO PROCEDURE OLY (KITS) ×3 IMPLANT
MARKER SPOT ENDO TATTOO 5ML (MISCELLANEOUS) IMPLANT
PAD GROUND ADULT SPLIT (MISCELLANEOUS) IMPLANT
SNARE SHORT THROW 13M SML OVAL (MISCELLANEOUS) IMPLANT
SNARE SHORT THROW 30M LRG OVAL (MISCELLANEOUS) IMPLANT
SPOT EX ENDOSCOPIC TATTOO (MISCELLANEOUS)
SYR INFLATION 60ML (SYRINGE) IMPLANT
VARIJECT INJECTOR VIN23 (MISCELLANEOUS)
WATER STERILE IRR 250ML POUR (IV SOLUTION) ×3 IMPLANT
WIDE-EYE POLYPTRAP (MISCELLANEOUS) IMPLANT
WIRE CRE 18-20MM 8CM F G (MISCELLANEOUS) IMPLANT

## 2015-06-24 NOTE — Anesthesia Preprocedure Evaluation (Signed)
Anesthesia Evaluation  Patient identified by MRN, date of birth, ID band Patient awake    Reviewed: Allergy & Precautions, H&P , Patient's Chart, lab work & pertinent test results  Airway Mallampati: I  TM Distance: >3 FB Neck ROM: full    Dental no notable dental hx.    Pulmonary neg pulmonary ROS,    Pulmonary exam normal        Cardiovascular negative cardio ROS Normal cardiovascular exam     Neuro/Psych  Headaches,    GI/Hepatic Neg liver ROS, GERD  Medicated,  Endo/Other  negative endocrine ROS  Renal/GU   negative genitourinary   Musculoskeletal   Abdominal   Peds  Hematology negative hematology ROS (+)   Anesthesia Other Findings   Reproductive/Obstetrics                             Anesthesia Physical Anesthesia Plan  ASA: I  Anesthesia Plan: MAC   Post-op Pain Management:    Induction:   Airway Management Planned:   Additional Equipment:   Intra-op Plan:   Post-operative Plan:   Informed Consent: I have reviewed the patients History and Physical, chart, labs and discussed the procedure including the risks, benefits and alternatives for the proposed anesthesia with the patient or authorized representative who has indicated his/her understanding and acceptance.     Plan Discussed with: CRNA  Anesthesia Plan Comments:         Anesthesia Quick Evaluation

## 2015-06-24 NOTE — Anesthesia Postprocedure Evaluation (Signed)
Anesthesia Post Note  Patient: Tony Parsons  Procedure(s) Performed: Procedure(s) (LRB): ESOPHAGOGASTRODUODENOSCOPY (EGD) WITH PROPOFOL (N/A)  Patient location during evaluation: PACU Anesthesia Type: MAC Level of consciousness: awake and alert Pain management: pain level controlled Vital Signs Assessment: post-procedure vital signs reviewed and stable Respiratory status: spontaneous breathing and respiratory function stable Cardiovascular status: stable Postop Assessment: no headache, no signs of nausea or vomiting and adequate PO intake Anesthetic complications: no    Sharla Tankard, III,  Bannie Lobban D

## 2015-06-24 NOTE — Transfer of Care (Signed)
Immediate Anesthesia Transfer of Care Note  Patient: Tony Parsons  Procedure(s) Performed: Procedure(s): ESOPHAGOGASTRODUODENOSCOPY (EGD) WITH PROPOFOL (N/A)  Patient Location: PACU  Anesthesia Type: MAC  Level of Consciousness: awake, alert  and patient cooperative  Airway and Oxygen Therapy: Patient Spontanous Breathing and Patient connected to supplemental oxygen  Post-op Assessment: Post-op Vital signs reviewed, Patient's Cardiovascular Status Stable, Respiratory Function Stable, Patent Airway and No signs of Nausea or vomiting  Post-op Vital Signs: Reviewed and stable  Complications: No apparent anesthesia complications

## 2015-06-24 NOTE — H&P (Signed)
[facility]  [address], [address] [place], [place] [address] Phone: [phone] Fax : [phone]  Primary Care Physician:  Ailene Ravel, MD Primary Gastroenterologist:  Dr. Servando Snare  Pre-Procedure History & Physical: HPI:  Tony Parsons is a 30 y.o. male is here for an endoscopy.   Past Medical History  Diagnosis Date  . Kidney stone 08/2011  . Depression   . Anxiety   . Inguinal hernia   . GERD (gastroesophageal reflux disease)   . Headache     migraines - 2x/week  . Arthritis     Rheumatoid - scheduling to see rheumatologist    Past Surgical History  Procedure Laterality Date  . Wisdom tooth extraction  2005  . Upper gastrointestinal endoscopy  1999  . Inguinal hernia repair  12/25/2011    Procedure: HERNIA REPAIR INGUINAL ADULT;  Surgeon: Robyne Askew, MD;  Location: [facility] OR;  Service: General;  Laterality: Right;    Prior to Admission medications   Medication Sig Start Date End Date Taking? Authorizing Provider  acetaminophen (TYLENOL) 500 MG tablet Take 1,000 mg by mouth 3 (three) times daily.   Yes Historical Provider, MD  Famotidine (PEPCID PO) Take by mouth as needed.   Yes Historical Provider, MD  Ondansetron HCl (ZOFRAN PO) Take by mouth as needed.   Yes Historical Provider, MD  sucralfate (CARAFATE) 1 g tablet Take 1 tablet (1 g total) by mouth 4 (four) times daily. 06/15/15 06/14/16 Yes Jene Every, MD  traMADol (ULTRAM) 50 MG tablet Take 1 tablet (50 mg total) by mouth every 6 (six) hours as needed. 06/15/15 06/14/16 Yes Jene Every, MD    Allergies as of 06/21/2015 - Review Complete 06/21/2015  Allergen Reaction Noted  . Codeine Itching 09/19/2011  . Oxycodone Other (See Comments) 09/19/2011    Family History  Problem Relation Age of Onset  . Diabetes Mother   . Hypertension Father   . Arthritis Father   . Melanoma Sister   . Melanoma Maternal Grandmother     Social History   Social History  . Marital Status: Married    Spouse  Name: N/A  . Number of Children: N/A  . Years of Education: N/A   Occupational History  . Not on file.   Social History Main Topics  . Smoking status: Never Smoker   . Smokeless tobacco: Not on file  . Alcohol Use: 3.0 oz/week    5 Cans of beer per week     Comment:    . Drug Use: No  . Sexual Activity: Yes   Other Topics Concern  . Not on file   Social History Narrative    Review of Systems: See HPI, otherwise negative ROS  Physical Exam: BP 131/85 mmHg  Pulse 80  Temp(Src) 97.7 F (36.5 C) (Temporal)  Resp 12  Ht 5\' 11"  (1.803 m)  Wt 162 lb 9.6 oz (73.755 kg)  BMI 22.69 kg/m2  SpO2 99% General:   Alert,  pleasant and cooperative in NAD Head:  Normocephalic and atraumatic. Neck:  Supple; no masses or thyromegaly. Lungs:  Clear throughout to auscultation.    Heart:  Regular rate and rhythm. Abdomen:  Soft, nontender and nondistended. Normal bowel sounds, without guarding, and without rebound.   Neurologic:  Alert and  oriented x4;  grossly normal neurologically.  Impression/Plan: VAUGHAN GARFINKLE is here for an endoscopy to be performed for abd pain  Risks, benefits, limitations, and alternatives regarding  endoscopy have been reviewed with the  patient.  Questions have been answered.  All parties agreeable.   Darlina Rumpf, MD  06/24/2015, 11:10 AM

## 2015-06-24 NOTE — Op Note (Signed)
[facility] Patient Name: Tony Parsons Procedure Date: 06/24/2015 11:53 AM MRN: [id number] Account #: [id number] Date of Birth: Jan 09, 1986 Admit Type: Outpatient Age: 30 Room: [facility] OR ROOM 01 Gender: Male Note Status: Finalized Procedure:            Upper GI endoscopy Indications:          Epigastric abdominal pain Providers:            Midge Minium, MD Referring MD:         Durward Fortes. Hamrick, MD (Referring MD) Medicines:            Propofol per Anesthesia Complications:        No immediate complications. Procedure:            Pre-Anesthesia Assessment:                       - Prior to the procedure, a History and Physical was                        performed, and patient medications and allergies were                        reviewed. The patient's tolerance of previous                        anesthesia was also reviewed. The risks and benefits of                        the procedure and the sedation options and risks were                        discussed with the patient. All questions were                        answered, and informed consent was obtained. Prior                        Anticoagulants: The patient has taken no previous                        anticoagulant or antiplatelet agents. ASA Grade                        Assessment: II - A patient with mild systemic disease.                        After reviewing the risks and benefits, the patient was                        deemed in satisfactory condition to undergo the                        procedure.                       After obtaining informed consent, the endoscope was                        passed under direct vision. Throughout the procedure,  the patient's blood pressure, pulse, and oxygen                        saturations were monitored continuously. The Olympus                        GIF H180J colonscope (Y#:[id number]) was introduced   through the mouth, and advanced to the second part of                        duodenum. The upper GI endoscopy was accomplished                        without difficulty. The patient tolerated the procedure                        well. Findings:      The esophagus was normal.      The stomach was normal.      The examined duodenum was normal. Impression:           - Normal esophagus.                       - Normal stomach.                       - Normal examined duodenum.                       - No specimens collected. Recommendation:       - Return to referring physician. Procedure Code(s):    --- Professional ---                       [phone], Esophagogastroduodenoscopy, flexible, transoral;                        diagnostic, including collection of specimen(s) by                        brushing or washing, when performed (separate procedure) Diagnosis Code(s):    --- Professional ---                       R10.13, Epigastric pain CPT copyright 2016 American [employer] Association. All rights reserved. The codes documented in this report are preliminary and upon coder review may  be revised to meet current compliance requirements. Midge Minium, MD 06/24/2015 12:04:36 PM This report has been signed electronically. Number of Addenda: 0 Note Initiated On: 06/24/2015 11:53 AM Total Procedure Duration: 0 hours 2 minutes 40 seconds       [facility]

## 2015-06-24 NOTE — Anesthesia Procedure Notes (Addendum)
Procedure Name: MAC Performed by: Andres Escandon Pre-anesthesia Checklist: Patient identified, Emergency Drugs available, Suction available, Timeout performed and Patient being monitored Patient Re-evaluated:Patient Re-evaluated prior to inductionOxygen Delivery Method: Nasal cannula Placement Confirmation: positive ETCO2       

## 2015-06-28 ENCOUNTER — Telehealth: Payer: Self-pay

## 2015-06-28 NOTE — Telephone Encounter (Signed)
Spoke with Dr. Orvis Brill regarding this patient. Since he is very adamant about scheduling surgery as soon as possible, she has given permission for patient to be scheduled with first available surgeon.  Will speak with Surgeon on call to see if this can be added on this week.

## 2015-06-28 NOTE — Telephone Encounter (Signed)
Reviewed normal EGD results. Since these are normal, patient will need scheduled for Laparoscopic Cholecystectomy with Dr. Orvis Brill.   Reviewed all information with patient and patient is still having nausea and wishes to proceed with surgery.   Please schedule surgery as soon as possible and notify patient.

## 2015-06-29 NOTE — Telephone Encounter (Signed)
Pt advised of pre op date/time and sx date. Sx: 07/01/15 with Dr Ely--Laparoscopic cholecystectomy. Pre op: 06/30/15 between 1-5:00pm--Phone.   Patient made aware to call [phone], between 1-3:00pm the day before surgery, to find out what time to arrive.

## 2015-06-29 NOTE — Telephone Encounter (Signed)
Spoke with Dr. Orvis Brill in regards to this patient. She is unable to do surgery until next month but has spoke with Dr. Michela Pitcher who has agreed to do the surgery on Thursday, 07/01/15. Patient is in agreement with this plan.  Please schedule Laparoscopic Cholecystectomy on 07/01/15 with Dr. Michela Pitcher and patient can do a Telephone pre-op as all labs and testing have been done.  Please call patient and give specifics on dates and times once you get this arranged.

## 2015-06-30 ENCOUNTER — Encounter: Payer: Self-pay | Admitting: *Deleted

## 2015-06-30 ENCOUNTER — Other Ambulatory Visit: Payer: BLUE CROSS/BLUE SHIELD

## 2015-06-30 NOTE — Pre-Procedure Instructions (Signed)
CALLED DR VAN STAVERN REGARDING PTS EKG THAT WAS DONE ON 06-15-15 IN ED DUE TO GALLBLADDER ATTACK-PT HAS NO H/O HTN OR DIABETES- DR VAN STAVERN INFORMED OF ED EKG AND THEN I TOLD HIM THAT THE ED MD, DR Cyril Loosen, INTERPRETED THE EKG AS NORMAL IN HIS NOTE. I COPY AND PASTED HIS INTERPRETATION OF THE EKG IN NOTES. DR VAN STAVERN SAID PT SHOULD BE OK FOR SURGERY TOMORROW

## 2015-06-30 NOTE — Pre-Procedure Instructions (Signed)
Patient Name Sex DOB SSN   Tony Parsons, Tony Parsons Male 07/30/1985 [id number]    ED Provider Notes by Jene Every, MD at 06/15/2015 2:47 PM    Author: Jene Every, MD Service: (none) Author Type: Physician   Filed: 06/15/2015 3:10 PM Note Time: 06/15/2015 2:47 PM Status: Signed   Editor: Jene Every, MD (Physician)     Expand All Collapse All   [facility] Emergency Department Provider Note  ____________________________________________    I have reviewed the triage vital signs and the nursing notes.   HISTORY  Chief Complaint Chest Pain and Abdominal Pain    HPI Tony Parsons is a 30 y.o. male who presents with epigastric pain which has been on and off over the last 4 months. He reports more recently he has been feeling nauseous as well. He denies fevers or chills. Occasionally he has burning discomfort in his chest. His PCP put him on Prilosec but he reports this has not helped. No recent travel. No calf pain. No leg swelling. No shortness of breath. No cough.     Past Medical History  Diagnosis Date  . Kidney stone 08/2011  . Depression   . Anxiety   . Inguinal hernia     Patient Active Problem List   Diagnosis Date Noted  . Right inguinal hernia 11/21/2011    Past Surgical History  Procedure Laterality Date  . Wisdom tooth extraction  2005  . Upper gastrointestinal endoscopy  1999  . Inguinal hernia repair  12/25/2011    Procedure: HERNIA REPAIR INGUINAL ADULT; Surgeon: Robyne Askew, MD; Location: [facility] OR; Service: General; Laterality: Right;  . Hernia repair  12/25/11    RIH repair    Current Outpatient Rx  Name  Route  Sig  Dispense  Refill  . escitalopram (LEXAPRO) 10 MG tablet   Oral   Take 10 mg by mouth daily.         Marland Kitchen ketorolac (TORADOL) 10 MG tablet   Oral   Take 10 mg by mouth every 6 (six) hours as needed.         .  ondansetron (ZOFRAN) 4 MG tablet   Oral   Take 1 tablet (4 mg total) by mouth daily as needed for nausea or vomiting.   20 tablet   1   . sucralfate (CARAFATE) 1 g tablet   Oral   Take 1 tablet (1 g total) by mouth 4 (four) times daily.   60 tablet   1   . traMADol (ULTRAM) 50 MG tablet   Oral   Take 1 tablet (50 mg total) by mouth every 6 (six) hours as needed.   20 tablet   0     Allergies Codeine and Oxycodone  Family History  Problem Relation Age of Onset  . Diabetes Mother   . Hypertension Father   . Arthritis Father     Social History Social History  Substance Use Topics  . Smoking status: Never Smoker   . Smokeless tobacco: None  . Alcohol Use: Yes     Comment: Occassional Use    Review of Systems  Constitutional: Negative for fever.  ENT: Negative for sore throat Cardiovascular: Negative for chest pain  Respiratory: Negative for shortness of breath.No cough Gastrointestinal: As above  Musculoskeletal: Negative for back pain. Skin: Negative for rash. Neurological: Negative for headache Psychiatric: no anxiety    ____________________________________________   PHYSICAL EXAM:  VITAL SIGNS: ED Triage Vitals  Enc Vitals Group  BP 06/15/15 0949 148/96 mmHg   Pulse Rate 06/15/15 0949 94   Resp 06/15/15 0949 16   Temp 06/15/15 0949 98.4 F (36.9 C)   Temp Source 06/15/15 0949 Oral   SpO2 06/15/15 0949 99 %   Weight 06/15/15 0949 175 lb (79.379 kg)   Height 06/15/15 0949 5\' 11"  (1.803 m)   Head Cir --    Peak Flow --    Pain Score 06/15/15 0950 8   Pain Loc --    Pain Edu? --    Excl. in GC? --      Constitutional: Alert and oriented. Well appearing and in no distress.  Eyes: Conjunctivae are normal. No erythema or injection ENT   Head: Normocephalic and atraumatic.   Mouth/Throat: Mucous  membranes are moist. Cardiovascular: Normal rate, regular rhythm. Normal and symmetric distal pulses are present in the upper extremities.  Respiratory: Normal respiratory effort without tachypnea nor retractions. Gastrointestinal: Soft and non-tender in all quadrants. No distention. There is no CVA tenderness. Genitourinary: deferred Musculoskeletal: Nontender with normal range of motion in all extremities.  Neurologic: Normal speech and language. No gross focal neurologic deficits are appreciated. Skin: Skin is warm, dry and intact. No rash noted. Psychiatric: Mood and affect are normal. Patient exhibits appropriate insight and judgment.  ____________________________________________   LABS (pertinent positives/negatives)  Labs Reviewed  COMPREHENSIVE METABOLIC PANEL - Abnormal; Notable for the following:    Glucose, Bld 105 (*)    All other components within normal limits  URINALYSIS COMPLETEWITH MICROSCOPIC ([facility] ONLY) - Abnormal; Notable for the following:    Color, Urine YELLOW (*)    APPearance CLEAR (*)    All other components within normal limits  LIPASE, BLOOD  CBC  TROPONIN I    ____________________________________________   EKG ED ECG REPORT I, 08/15/15, the attending physician, personally viewed and interpreted this ECG.  Date: 06/15/2015 EKG Time: 953 Rate: 91 Rhythm: normal sinus rhythm QRS Axis: normal Intervals: normal ST/T Wave abnormalities: normal Conduction Disturbances: none Narrative Interpretation: unremarkable    ____________________________________________   RADIOLOGY  Chest x-ray is unremarkable  Ultrasound shows nonspecific gallbladder wall thickening  ____________________________________________   PROCEDURES  Procedure(s) performed: none  Critical Care performed: none  ____________________________________________   INITIAL IMPRESSION / ASSESSMENT AND PLAN / ED COURSE  Pertinent labs  & imaging results that were available during my care of the patient were reviewed by me and considered in my medical decision making (see chart for details).  Patient presents with several months of worsening abdominal discomfort in epigastrium. Differential diagnosis includes gastritis/GERD/PUD/cholecystitis/pancreatitis. Lipase is normal. Ultrasound shows nonspecific thickening the patient has no tenderness over the gallbladder. I'm suspicious of PUD and communicated as such to the patient and his wife. The patient is comfortable in the emergency department and in no acute distress. His exam is unremarkable. His lab work is reassuring  They are very insistent that the gallbladder be removed today. I explained that do not see an indication for that but I did speak with Dr. 08/15/2015 of surgery who will see the patient in his office this week.  I also suggested follow up with GI as EGD is likely necessary.  Again seemed quite disappointed that we could not take him to the operating room today I tried to explain to them as best as I could that we do not have a clear indication to do that emergently and hence outpatient follow-up is appropriate  ____________________________________________   FINAL CLINICAL IMPRESSION(S) / ED DIAGNOSES  Final diagnoses:  Pain of upper abdomen          Jene Every, MD 06/15/15 1510

## 2015-06-30 NOTE — Patient Instructions (Signed)
  Your procedure is scheduled on: 07-01-15 Report to [facility] SAME DAY SURGERY 2ND FLOOR @ 9 AM PER PT   Remember: Instructions that are not followed completely may result in serious medical risk, up to and including death, or upon the discretion of your surgeon and anesthesiologist your surgery may need to be rescheduled.    _X___ 1. Do not eat food or drink liquids after midnight. No gum chewing or hard candies.     _X___ 2. No Alcohol for 24 hours before or after surgery.   ____ 3. Bring all medications with you on the day of surgery if instructed.    ____ 4. Notify your doctor if there is any change in your medical condition     (cold, fever, infections).     Do not wear jewelry, make-up, hairpins, clips or nail polish.  Do not wear lotions, powders, or perfumes. You may wear deodorant.  Do not shave 48 hours prior to surgery. Men may shave face and neck.  Do not bring valuables to the hospital.    [facility] is not responsible for any belongings or valuables.               Contacts, dentures or bridgework may not be worn into surgery.  Leave your suitcase in the car. After surgery it may be brought to your room.  For patients admitted to the hospital, discharge time is determined by your treatment team.   Patients discharged the day of surgery will not be allowed to drive home.   Please read over the following fact sheets that you were given:     _X___ Take these medicines the morning of surgery with A SIP OF WATER:    1. PEPCID  2. TAKE A PEPCID TONIGHT BEFORE BED  3.   4.  5.  6.  ____ Fleet Enema (as directed)   ____ Use CHG Soap as directed  ____ Use inhalers on the day of surgery  ____ Stop metformin 2 days prior to surgery    ____ Take 1/2 of usual insulin dose the night before surgery and none on the morning of surgery.   ____ Stop Coumadin/Plavix/aspirin-N/A  ____ Stop Anti-inflammatories-NO NSAIDS OR ASA PRODUCTS-TYLENOL OK TO TAKE   ____ Stop  supplements until after surgery.    ____ Bring C-Pap to the hospital.

## 2015-06-30 NOTE — Pre-Procedure Instructions (Signed)
CALLED AMY AT DR ELYS OFFICE AND INFORMED HER DR ELY HAS NOT PUT HIS ORDERS IN EPIC

## 2015-07-01 ENCOUNTER — Encounter
Admission: RE | Disposition: A | Discharge status: Home / Self Care | Payer: Self-pay | Source: Ambulatory Visit | Attending: Surgery

## 2015-07-01 ENCOUNTER — Telehealth: Payer: Self-pay | Admitting: Surgery

## 2015-07-01 ENCOUNTER — Ambulatory Visit
Admission: RE | Admit: 2015-07-01 | Discharge: 2015-07-01 | Disposition: A | Discharge status: Home / Self Care | Payer: BLUE CROSS/BLUE SHIELD | Source: Ambulatory Visit | Attending: Surgery | Admitting: Surgery

## 2015-07-01 ENCOUNTER — Encounter: Payer: Self-pay | Admitting: *Deleted

## 2015-07-01 ENCOUNTER — Ambulatory Visit: Payer: BLUE CROSS/BLUE SHIELD

## 2015-07-01 ENCOUNTER — Ambulatory Visit: Payer: BLUE CROSS/BLUE SHIELD | Admitting: Anesthesiology

## 2015-07-01 DIAGNOSIS — Z87442 Personal history of urinary calculi: Secondary | ICD-10-CM | POA: Insufficient documentation

## 2015-07-01 DIAGNOSIS — K810 Acute cholecystitis: Secondary | ICD-10-CM | POA: Insufficient documentation

## 2015-07-01 DIAGNOSIS — F419 Anxiety disorder, unspecified: Secondary | ICD-10-CM | POA: Diagnosis not present

## 2015-07-01 DIAGNOSIS — F329 Major depressive disorder, single episode, unspecified: Secondary | ICD-10-CM | POA: Diagnosis not present

## 2015-07-01 DIAGNOSIS — Z419 Encounter for procedure for purposes other than remedying health state, unspecified: Secondary | ICD-10-CM

## 2015-07-01 DIAGNOSIS — K219 Gastro-esophageal reflux disease without esophagitis: Secondary | ICD-10-CM | POA: Diagnosis not present

## 2015-07-01 DIAGNOSIS — M069 Rheumatoid arthritis, unspecified: Secondary | ICD-10-CM | POA: Insufficient documentation

## 2015-07-01 DIAGNOSIS — G43909 Migraine, unspecified, not intractable, without status migrainosus: Secondary | ICD-10-CM | POA: Diagnosis not present

## 2015-07-01 DIAGNOSIS — K811 Chronic cholecystitis: Secondary | ICD-10-CM | POA: Insufficient documentation

## 2015-07-01 DIAGNOSIS — R112 Nausea with vomiting, unspecified: Secondary | ICD-10-CM | POA: Insufficient documentation

## 2015-07-01 HISTORY — PX: CHOLECYSTECTOMY: SHX55

## 2015-07-01 SURGERY — LAPAROSCOPIC CHOLECYSTECTOMY
Anesthesia: General | Wound class: Clean Contaminated

## 2015-07-01 MED ORDER — FENTANYL CITRATE (PF) 100 MCG/2ML IJ SOLN
25.0000 ug | INTRAMUSCULAR | Status: DC | PRN
  Administered 2015-07-01 (×3): 25 ug via INTRAVENOUS

## 2015-07-01 MED ORDER — HEPARIN SODIUM (PORCINE) 5000 UNIT/ML IJ SOLN
INTRAMUSCULAR | Status: AC
  Filled 2015-07-01: qty 1

## 2015-07-01 MED ORDER — FENTANYL CITRATE (PF) 100 MCG/2ML IJ SOLN
INTRAMUSCULAR | Status: AC
  Filled 2015-07-01: qty 2

## 2015-07-01 MED ORDER — KETOROLAC TROMETHAMINE 30 MG/ML IJ SOLN
INTRAMUSCULAR | Status: DC | PRN
  Administered 2015-07-01: 30 mg via INTRAVENOUS

## 2015-07-01 MED ORDER — MIDAZOLAM HCL 2 MG/2ML IJ SOLN
INTRAMUSCULAR | Status: DC | PRN
  Administered 2015-07-01: 2 mg via INTRAVENOUS

## 2015-07-01 MED ORDER — BUPIVACAINE HCL (PF) 0.25 % IJ SOLN
INTRAMUSCULAR | Status: AC
  Filled 2015-07-01: qty 30

## 2015-07-01 MED ORDER — GLYCOPYRROLATE 0.2 MG/ML IJ SOLN
INTRAMUSCULAR | Status: DC | PRN
  Administered 2015-07-01: 0.4 mg via INTRAVENOUS
  Administered 2015-07-01: 0.2 mg via INTRAVENOUS

## 2015-07-01 MED ORDER — LABETALOL HCL 5 MG/ML IV SOLN
INTRAVENOUS | Status: DC | PRN
  Administered 2015-07-01: 15 mg via INTRAVENOUS

## 2015-07-01 MED ORDER — ONDANSETRON HCL 4 MG/2ML IJ SOLN
INTRAMUSCULAR | Status: DC | PRN
  Administered 2015-07-01: 4 mg via INTRAVENOUS

## 2015-07-01 MED ORDER — LACTATED RINGERS IV SOLN
INTRAVENOUS | Status: DC
  Administered 2015-07-01: 50 mL/h via INTRAVENOUS

## 2015-07-01 MED ORDER — CEFAZOLIN SODIUM 1-5 GM-% IV SOLN
INTRAVENOUS | Status: AC
  Filled 2015-07-01: qty 50

## 2015-07-01 MED ORDER — ROCURONIUM BROMIDE 100 MG/10ML IV SOLN
INTRAVENOUS | Status: DC | PRN
  Administered 2015-07-01: 40 mg via INTRAVENOUS

## 2015-07-01 MED ORDER — DEXAMETHASONE SODIUM PHOSPHATE 4 MG/ML IJ SOLN
INTRAMUSCULAR | Status: DC | PRN
  Administered 2015-07-01: 5 mg via INTRAVENOUS

## 2015-07-01 MED ORDER — TRAMADOL HCL 50 MG PO TABS: 50.0000 mg | ORAL_TABLET | Freq: Four times a day (QID) | ORAL | Status: DC | PRN

## 2015-07-01 MED ORDER — FENTANYL CITRATE (PF) 100 MCG/2ML IJ SOLN
INTRAMUSCULAR | Status: DC | PRN
  Administered 2015-07-01: 250 ug via INTRAVENOUS

## 2015-07-01 MED ORDER — TRAMADOL HCL 50 MG PO TABS
50.0000 mg | ORAL_TABLET | Freq: Once | ORAL | Status: AC
  Administered 2015-07-01: 50 mg via ORAL

## 2015-07-01 MED ORDER — BUPIVACAINE HCL (PF) 0.25 % IJ SOLN
INTRAMUSCULAR | Status: DC | PRN
  Administered 2015-07-01: 30 mL

## 2015-07-01 MED ORDER — ONDANSETRON HCL 4 MG/2ML IJ SOLN: 4.0000 mg | Freq: Once | INTRAMUSCULAR | Status: DC | PRN

## 2015-07-01 MED ORDER — HYDROCODONE-ACETAMINOPHEN 5-325 MG PO TABS: 1.0000 | ORAL_TABLET | Freq: Four times a day (QID) | ORAL | Status: DC | PRN

## 2015-07-01 MED ORDER — NEOSTIGMINE METHYLSULFATE 10 MG/10ML IV SOLN
INTRAVENOUS | Status: DC | PRN
  Administered 2015-07-01: 3 mg via INTRAVENOUS

## 2015-07-01 MED ORDER — TRAMADOL HCL 50 MG PO TABS
ORAL_TABLET | ORAL | Status: AC
  Filled 2015-07-01: qty 1

## 2015-07-01 MED ORDER — PROPOFOL 10 MG/ML IV BOLUS
INTRAVENOUS | Status: DC | PRN
  Administered 2015-07-01: 150 mg via INTRAVENOUS

## 2015-07-01 MED ORDER — ENOXAPARIN SODIUM 40 MG/0.4ML ~~LOC~~ SOLN
40.0000 mg | SUBCUTANEOUS | Status: AC
  Administered 2015-07-01: 40 mg via SUBCUTANEOUS
  Filled 2015-07-01: qty 0.4

## 2015-07-01 MED ORDER — FAMOTIDINE 20 MG PO TABS
ORAL_TABLET | ORAL | Status: AC
  Administered 2015-07-01: 20 mg via ORAL
  Filled 2015-07-01: qty 1

## 2015-07-01 MED ORDER — EPHEDRINE SULFATE 50 MG/ML IJ SOLN
INTRAMUSCULAR | Status: DC | PRN
  Administered 2015-07-01: 10 mg via INTRAVENOUS

## 2015-07-01 MED ORDER — CEFAZOLIN SODIUM 1-5 GM-% IV SOLN
1.0000 g | INTRAVENOUS | Status: AC
  Administered 2015-07-01: 1 g via INTRAVENOUS

## 2015-07-01 MED ORDER — FAMOTIDINE 20 MG PO TABS
20.0000 mg | ORAL_TABLET | Freq: Once | ORAL | Status: AC
  Administered 2015-07-01: 20 mg via ORAL

## 2015-07-01 MED ORDER — LIDOCAINE HCL (CARDIAC) 20 MG/ML IV SOLN
INTRAVENOUS | Status: DC | PRN
  Administered 2015-07-01: 100 mg via INTRAVENOUS

## 2015-07-01 SURGICAL SUPPLY — 46 items
APPLIER CLIP ROT 10 11.4 M/L (STAPLE) ×3
APR CLP MED LRG 11.4X10 (STAPLE) ×1
BAG COUNTER SPONGE EZ (MISCELLANEOUS) ×2 IMPLANT
BAG SPNG 4X4 CLR HAZ (MISCELLANEOUS) ×1
CANISTER SUCT 1200ML W/VALVE (MISCELLANEOUS) ×3 IMPLANT
CATH REDDICK CHOLANGI 4FR 50CM (CATHETERS) ×3 IMPLANT
CHLORAPREP W/TINT 26ML (MISCELLANEOUS) ×3 IMPLANT
CLIP APPLIE ROT 10 11.4 M/L (STAPLE) ×1 IMPLANT
CONRAY 60ML FOR OR (MISCELLANEOUS) ×3 IMPLANT
CORD MONOPOLAR M/FML 12FT (MISCELLANEOUS) ×2 IMPLANT
COUNTER SPONGE BAG EZ (MISCELLANEOUS) ×1
DRAPE SHEET LG 3/4 BI-LAMINATE (DRAPES) ×3 IMPLANT
DRSG TEGADERM 2-3/8X2-3/4 SM (GAUZE/BANDAGES/DRESSINGS) ×12 IMPLANT
DRSG TELFA 3X8 NADH (GAUZE/BANDAGES/DRESSINGS) ×3 IMPLANT
ELECT REM PT RETURN 9FT ADLT (ELECTROSURGICAL) ×3
ELECTRODE REM PT RTRN 9FT ADLT (ELECTROSURGICAL) ×1 IMPLANT
GLOVE BIO SURGEON STRL SZ7.5 (GLOVE) ×3 IMPLANT
GLOVE INDICATOR 8.0 STRL GRN (GLOVE) ×3 IMPLANT
GOWN STRL REUS W/ TWL LRG LVL3 (GOWN DISPOSABLE) ×2 IMPLANT
GOWN STRL REUS W/TWL LRG LVL3 (GOWN DISPOSABLE) ×6
GRASPER SUT TROCAR 14GX15 (MISCELLANEOUS) ×3 IMPLANT
IRRIGATION STRYKERFLOW (MISCELLANEOUS) ×1 IMPLANT
IRRIGATOR STRYKERFLOW (MISCELLANEOUS) ×3
IV NS 1000ML (IV SOLUTION) ×3
IV NS 1000ML BAXH (IV SOLUTION) ×1 IMPLANT
LABEL OR SOLS (LABEL) ×1 IMPLANT
NDL HYPO 25X1 1.5 SAFETY (NEEDLE) ×1 IMPLANT
NDL INSUFFLATION 14GA 120MM (NEEDLE) ×1 IMPLANT
NDL SAFETY 18GX1.5 (NEEDLE) ×3 IMPLANT
NEEDLE HYPO 25X1 1.5 SAFETY (NEEDLE) ×3 IMPLANT
NEEDLE INSUFFLATION 14GA 120MM (NEEDLE) ×3 IMPLANT
NS IRRIG 500ML POUR BTL (IV SOLUTION) ×3 IMPLANT
PACK LAP CHOLECYSTECTOMY (MISCELLANEOUS) ×3 IMPLANT
PAD DRESSING TELFA 3X8 NADH (GAUZE/BANDAGES/DRESSINGS) ×1 IMPLANT
POUCH ENDO CATCH 10MM SPEC (MISCELLANEOUS) ×3 IMPLANT
SCISSORS METZENBAUM CVD 33 (INSTRUMENTS) ×3 IMPLANT
SEAL FOR SCOPE WARMER C3101 (MISCELLANEOUS) ×3 IMPLANT
SLEEVE ADV FIXATION 5X100MM (TROCAR) ×3 IMPLANT
SUT ETHILON 5-0 FS-2 18 BLK (SUTURE) ×3 IMPLANT
SUT VIC AB 0 CT2 27 (SUTURE) ×3 IMPLANT
SYR 3ML LL SCALE MARK (SYRINGE) ×3 IMPLANT
TROCAR Z-THREAD FIOS 11X100 BL (TROCAR) ×3 IMPLANT
TROCAR Z-THREAD OPTICAL 5X100M (TROCAR) ×3 IMPLANT
TROCAR Z-THREAD SLEEVE 11X100 (TROCAR) ×3 IMPLANT
TUBING INSUFFLATOR HI FLOW (MISCELLANEOUS) ×3 IMPLANT
WATER STERILE IRR 1000ML POUR (IV SOLUTION) ×1 IMPLANT

## 2015-07-01 NOTE — Anesthesia Preprocedure Evaluation (Signed)
Anesthesia Evaluation  Patient identified by MRN, date of birth, ID band Patient awake    Reviewed: Allergy & Precautions  Airway Mallampati: II       Dental  (+) Teeth Intact   Pulmonary neg pulmonary ROS,    breath sounds clear to auscultation       Cardiovascular Exercise Tolerance: Good  Rhythm:Regular Rate:Normal     Neuro/Psych Anxiety    GI/Hepatic Neg liver ROS, GERD  ,  Endo/Other  negative endocrine ROS  Renal/GU negative Renal ROS     Musculoskeletal negative musculoskeletal ROS (+)   Abdominal Normal abdominal exam  (+)   Peds  Hematology negative hematology ROS (+)   Anesthesia Other Findings   Reproductive/Obstetrics                             Anesthesia Physical Anesthesia Plan  ASA: I  Anesthesia Plan: General   Post-op Pain Management:    Induction: Intravenous  Airway Management Planned: Oral ETT  Additional Equipment:   Intra-op Plan:   Post-operative Plan: Extubation in OR  Informed Consent: I have reviewed the patients History and Physical, chart, labs and discussed the procedure including the risks, benefits and alternatives for the proposed anesthesia with the patient or authorized representative who has indicated his/her understanding and acceptance.     Plan Discussed with: CRNA  Anesthesia Plan Comments:         Anesthesia Quick Evaluation

## 2015-07-01 NOTE — Anesthesia Procedure Notes (Signed)
Procedure Name: Intubation Date/Time: 07/01/2015 10:22 AM Performed by: Shirlee Limerick, Vimal Derego Pre-anesthesia Checklist: Patient identified, Emergency Drugs available, Suction available and Patient being monitored Patient Re-evaluated:Patient Re-evaluated prior to inductionOxygen Delivery Method: Circle system utilized Preoxygenation: Pre-oxygenation with 100% oxygen Intubation Type: IV induction Laryngoscope Size: Mac and 3 Grade View: Grade I Tube type: Oral Tube size: 7.0 mm Number of attempts: 1 Placement Confirmation: ETT inserted through vocal cords under direct vision,  positive ETCO2 and breath sounds checked- equal and bilateral Secured at: 23 cm Tube secured with: Tape Dental Injury: Teeth and Oropharynx as per pre-operative assessment

## 2015-07-01 NOTE — Transfer of Care (Signed)
Immediate Anesthesia Transfer of Care Note  Patient: Tony Parsons  Procedure(s) Performed: Procedure(s): LAPAROSCOPIC CHOLECYSTECTOMY WITH CHOLANGIOGRAM (N/A)  Patient Location: PACU  Anesthesia Type:General  Level of Consciousness: awake, alert , oriented and patient cooperative  Airway & Oxygen Therapy: Patient Spontanous Breathing and Patient connected to nasal cannula oxygen  Post-op Assessment: Report given to RN and Post -op Vital signs reviewed and stable  Post vital signs: Reviewed and stable  Last Vitals:  Filed Vitals:   07/01/15 0908  BP: 135/95  Pulse: 67  Temp: 36.8 C  Resp: 16    Complications: No apparent anesthesia complications

## 2015-07-01 NOTE — H&P (Signed)
Tony Parsons is a 30 y.o. male  with a year's history of abdominal pain nausea and vomiting.  HPI: He has a long history of intermittent abdominal pain nausea and vomiting. His nausea is primarily after every meal. He appears to have more problems with fatty foods and salads. Workup has been largely unremarkable with exception of a possible gallstone in his gallbladder on ultrasound. Upper endoscopy was unremarkable. His nuclear medicine scan showed a hyperactive gallbladder on Traction with an exacerbation of his abdominal pain during that procedure.  He does not have any history of hepatitis, yellow jaundice, pancreatitis, peptic ulcer disease, previous diagnosis of gallbladder disease, or diverticulitis. His only previous abdominal surgery was left inguinal hernia repair. He's not had a colonoscopy. He has no other major medical problems. In view of his persistent symptoms and potential gallbladder issues he is admitted for elective left was A cholecystectomy.  Past Medical History  Diagnosis Date  . Kidney stone 08/2011  . Depression   . Anxiety   . Inguinal hernia   . GERD (gastroesophageal reflux disease)   . Headache     migraines - 2x/week  . Arthritis     Rheumatoid - scheduling to see rheumatologist   Past Surgical History  Procedure Laterality Date  . Wisdom tooth extraction  2005  . Upper gastrointestinal endoscopy  1999  . Inguinal hernia repair  12/25/2011    Procedure: HERNIA REPAIR INGUINAL ADULT;  Surgeon: Robyne Askew, MD;  Location: [facility] OR;  Service: General;  Laterality: Right;  . Esophagogastroduodenoscopy (egd) with propofol N/A 06/24/2015    Procedure: ESOPHAGOGASTRODUODENOSCOPY (EGD) WITH PROPOFOL;  Surgeon: Midge Minium, MD;  Location: [facility];  Service: Endoscopy;  Laterality: N/A;   Social History   Social History  . Marital Status: Married    Spouse Name: N/A  . Number of Children: N/A  . Years of Education: N/A   Social History Main Topics   . Smoking status: Never Smoker   . Smokeless tobacco: None     Comment: no passive smokers in home  . Alcohol Use: 3.0 oz/week    5 Cans of beer per week     Comment:    . Drug Use: No  . Sexual Activity: Yes   Other Topics Concern  . None   Social History Narrative     Review of Systems  Constitutional: Negative.   HENT: Negative.   Eyes: Negative.   Respiratory: Negative for cough, shortness of breath and wheezing.   Cardiovascular: Negative for chest pain and palpitations.  Gastrointestinal: Positive for heartburn, nausea, vomiting and abdominal pain. Negative for diarrhea and constipation.  Genitourinary: Negative.   Musculoskeletal: Negative.   Skin: Negative.   Neurological: Negative.   Psychiatric/Behavioral: Negative.      PHYSICAL EXAM: There were no vitals taken for this visit.  Physical Exam  Constitutional: He is oriented to person, place, and time. He appears well-developed and well-nourished. No distress.  HENT:  Head: Normocephalic and atraumatic.  Eyes: EOM are normal. Pupils are equal, round, and reactive to light.  Neck: Normal range of motion. Neck supple.  Cardiovascular: Normal rate, regular rhythm and normal heart sounds.   Pulmonary/Chest: Effort normal and breath sounds normal.  Abdominal: Soft. Bowel sounds are normal. He exhibits no distension. There is no tenderness.  Musculoskeletal: Normal range of motion. He exhibits no edema or tenderness.  Neurological: He is alert and oriented to person, place, and time.  Skin: Skin is warm and  dry.  Psychiatric: His behavior is normal. Judgment and thought content normal.   His abdominal exam is unremarkable to present time.  Impression/Plan: I have discussed the plans and indication for surgery with the patient detail. He is in agreement. We will plan to proceed later this morning. Risks benefits and options of been outlined and accepted   Tiney Rouge III, MD  07/01/2015, 9:10 AM

## 2015-07-01 NOTE — Discharge Instructions (Signed)
Laparoscopic Cholecystectomy, Care After  ° °These instructions give you information on caring for yourself after your procedure. Your doctor may also give you more specific instructions. Call your doctor if you have any problems or questions after your procedure.  °HOME CARE  °Change your bandages (dressings) as told by your doctor.  °Keep the wound dry and clean. Wash the wound gently with soap and water. Pat the wound dry with a clean towel.  °Do not take baths, swim, or use hot tubs for 2 weeks, or as told by your doctor.  °Only take medicine as told by your doctor.  °Eat a normal diet as told by your doctor.  °Do not lift anything heavier than 10 pounds (4.5 kg) until your doctor says it is okay.  °Do not play contact sports for 1 week, or as told by your doctor. °GET HELP IF:  °Your wound is red, puffy (swollen), or painful.  °You have yellowish-white fluid (pus) coming from the wound.  °You have fluid draining from the wound for more than 1 day.  °You have a bad smell coming from the wound.  °Your wound breaks open. °GET HELP RIGHT AWAY IF:  °You have trouble breathing.  °You have chest pain.  °You have a fever >101  °You have pain in the shoulders (shoulder strap areas) that is getting worse.  °You feel dizzy or pass out (faint).  °You have severe belly (abdominal) pain.  °You feel sick to your stomach (nauseous) or throw up (vomit) for more than 1 day. ° ° ° °AMBULATORY SURGERY  °DISCHARGE INSTRUCTIONS ° ° °1) The drugs that you were given will stay in your system until tomorrow so for the next 24 hours you should not: ° °A) Drive an automobile °B) Make any legal decisions °C) Drink any alcoholic beverage ° ° °2) You may resume regular meals tomorrow.  Today it is better to start with liquids and gradually work up to solid foods. ° °You may eat anything you prefer, but it is better to start with liquids, then soup and crackers, and gradually work up to solid foods. ° ° °3) Please notify your doctor  immediately if you have any unusual bleeding, trouble breathing, redness and pain at the surgery site, drainage, fever, or pain not relieved by medication. ° ° ° °4) Additional Instructions: ° ° ° ° ° ° ° °Please contact your physician with any problems or Same Day Surgery at [phone], Monday through Friday 6 am to 4 pm, or [facility] Main number at [phone]. ° ° °

## 2015-07-01 NOTE — Anesthesia Postprocedure Evaluation (Signed)
Anesthesia Post Note  Patient: Tony Parsons  Procedure(s) Performed: Procedure(s) (LRB): LAPAROSCOPIC CHOLECYSTECTOMY WITH CHOLANGIOGRAM (N/A)  Patient location during evaluation: PACU Anesthesia Type: General Level of consciousness: awake Pain management: pain level controlled Vital Signs Assessment: post-procedure vital signs reviewed and stable Respiratory status: spontaneous breathing Cardiovascular status: blood pressure returned to baseline Anesthetic complications: no    Last Vitals:  Filed Vitals:   07/01/15 0908 07/01/15 1125  BP: 135/95 103/87  Pulse: 67 97  Temp: 36.8 C 36.4 C  Resp: 16 19    Last Pain:  Filed Vitals:   07/01/15 1126  PainSc: 3                  VAN STAVEREN,Scout Gumbs

## 2015-07-01 NOTE — Telephone Encounter (Signed)
Patients wife, Amy, called stating patient had laparoscopic cholecystectomy this morning with Dr Michela Pitcher. He was discharged home with Tramadol. He has taken 1 and 1/2 pills of Tramadol and still cannot get his pain under control. Spoke with Dr Tonita Cong and he said he would write something else for pain and she can come to the office to pick it up. Amy Zenz was understanding of this information and said she would be at our office by 5pm today to pick up him prescription.

## 2015-07-01 NOTE — Op Note (Signed)
07/01/2015  11:21 AM  PATIENT:  Gevena Barre  30 y.o. male  PRE-OPERATIVE DIAGNOSIS:  cholecystitis  POST-OPERATIVE DIAGNOSIS:  cholecystitis  PROCEDURE:  Procedure(s): LAPAROSCOPIC CHOLECYSTECTOMY WITH CHOLANGIOGRAM (N/A)  SURGEON:  Surgeon(s) and Role:    * Tiney Rouge III, MD - Primary   ASSISTANTS: none   ANESTHESIA:   general  EBL:  Total I/O In: 925 [I.V.:925] Out: 25 [Blood:25]   DRAINS: none   LOCAL MEDICATIONS USED:  BUPIVICAINE    DISPOSITION OF SPECIMEN:  PATHOLOGY   DICTATION: .Dragon Dictation With the patient in the supine position and after induction of appropriate general anesthesia the patient's abdomen was prepped ChloraPrep and draped sterile towels. The patient was placed headdown feet up position. Small infraumbilical incision was made standard fashion carried down bluntly through subcutaneous tissue. A varies needle was used to cannulate peritoneal cavity. CO2 was insufflated to appropriate pressure measurements. When approximately 2 L of CO2 were instilled the needle was withdrawn and 11 mm port placed in intraperitoneal position. Intraperitoneal position was confirmed and CO2 was reinsufflated.  The patient was placed a head up feet down position rotated slightly to the left side. Subxiphoid transverse incision was made 11 mm port placed under direct vision. 2 lateral ports 5 mm in size were placed under direct vision. The gallbladder was identified. The abdomen was visualized and examined. There was a recurrent left inguinal hernia noted.  The gallbladder was retracted superiorly and laterally exposing the hepatoduodenal ligament. Cystic artery and cystic duct were identified. There were multiple adhesions to the gallbladder suggesting previous inflammatory change. The duct was clipped distally and opened. The duct was quite small and a cholangiocatheter could not be maneuvered into the duct. Cholangiography was abandoned the duct doubly clipped on the  common duct side and divided. Cystic artery was doubly clipped and divided.  The gallbladder was then dissected free from its bed in the liver using the hook and cautery apparatus. Once the gallbladder was free was removed through the epigastric incision using the claw grasper. Suction irrigated. The upper midline fascia was closed with figure-of-eight suture of 0 Vicryl the abdomen was then desufflated. Midline fascia was closed with figure-of-eight suture of 0 Vicryl. The skin was closed with 5-0 nylon. The area was infiltrated with 0.25% Marcaine for postoperative pain control. Sterile dressings were applied. The patient was then returned recovery room having tolerated the procedure well. Sponge instrument and needle count were correct 2 in the operating room. PLAN OF CARE: Discharge to home after PACU  PATIENT DISPOSITION:  PACU - hemodynamically stable.   Tiney Rouge III, MD

## 2015-07-01 NOTE — Telephone Encounter (Signed)
Spoke with Dr. Tonita Cong- Prescription has been printed and signed by Physician. Will place at [employer] for patient to pick up.

## 2015-07-02 ENCOUNTER — Encounter: Payer: Self-pay | Admitting: Surgery

## 2015-07-02 LAB — SURGICAL PATHOLOGY

## 2015-07-07 ENCOUNTER — Ambulatory Visit (INDEPENDENT_AMBULATORY_CARE_PROVIDER_SITE_OTHER): Payer: BLUE CROSS/BLUE SHIELD | Admitting: General Surgery

## 2015-07-07 ENCOUNTER — Encounter: Payer: Self-pay | Admitting: General Surgery

## 2015-07-07 VITALS — BP 139/92 | HR 91 | Temp 98.1°F | Ht 70.0 in | Wt 166.8 lb

## 2015-07-07 DIAGNOSIS — Z4889 Encounter for other specified surgical aftercare: Secondary | ICD-10-CM

## 2015-07-07 NOTE — Progress Notes (Signed)
Outpatient Surgical Follow Up  07/07/2015  Tony Parsons is an 30 y.o. male.   Chief Complaint  Patient presents with  . Routine Post Op    Laparoscopic Cholecystictectomy    HPI: 30 year old male returns to clinic for follow-up status post laparoscopic cholecystectomy. Patient reports the pain is at or surgeries mostly gone. Still having some discomfort his incision sites and on occasion requiring pain medication. He denies any fevers, chills, nausea, vomiting, chest pain, shortness of breath. He is having diarrhea after eating. He thinks this might be getting better. He's been very happy with his surgical results.  Past Medical History  Diagnosis Date  . Kidney stone 08/2011  . Depression   . Anxiety   . Inguinal hernia   . GERD (gastroesophageal reflux disease)   . Headache     migraines - 2x/week  . Arthritis     Rheumatoid - scheduling to see rheumatologist    Past Surgical History  Procedure Laterality Date  . Wisdom tooth extraction  2005  . Upper gastrointestinal endoscopy  1999  . Inguinal hernia repair  12/25/2011    Procedure: HERNIA REPAIR INGUINAL ADULT;  Surgeon: Robyne Askew, MD;  Location: [facility];  Service: General;  Laterality: Right;  . Esophagogastroduodenoscopy (egd) with propofol N/A 06/24/2015    Procedure: ESOPHAGOGASTRODUODENOSCOPY (EGD) WITH PROPOFOL;  Surgeon: Midge Minium, MD;  Location: [facility];  Service: Endoscopy;  Laterality: N/A;  . Cholecystectomy N/A 07/01/2015    Procedure: LAPAROSCOPIC CHOLECYSTECTOMY WITH CHOLANGIOGRAM;  Surgeon: Tiney Rouge III, MD;  Location: [facility];  Service: General;  Laterality: N/A;    Family History  Problem Relation Age of Onset  . Diabetes Mother   . Hypertension Father   . Arthritis Father   . Melanoma Sister   . Melanoma Maternal Grandmother     Social History:  reports that he has never smoked. He does not have any smokeless tobacco history on file. He reports that he drinks about 3.0 oz of  alcohol per week. He reports that he does not use illicit drugs.  Allergies:  Allergies  Allergen Reactions  . Oxycodone Other (See Comments)    Keeps pt up all night  . Codeine Itching    Medications reviewed.    ROS A multipoint review of systems was completed, all pertinent positives and negatives are documented within the history of present illness and the remainder negative.   BP 139/92 mmHg  Pulse 91  Temp(Src) 98.1 F (36.7 C) (Oral)  Ht 5\' 10"  (1.778 m)  Wt 75.66 kg (166 lb 12.8 oz)  BMI 23.93 kg/m2  Physical Exam  Gen.: No acute distress Chest: Clear to auscultation Heart: Regular rhythm Abdomen: Soft, nondistended, purple tender to palpation of his incision sites. Laparoscopic incisions all well approximated with sutures in place. Mild ecchymosis around the incision sites but without any evidence of erythema or purulence.   No results found for this or any previous visit (from the past 48 hour(s)). No results found.  Assessment/Plan:  1. Aftercare following surgery 30 year old male status post laparoscopic cholecystectomy. Discussed anticipated recovery and postoperative course. Discussed that his diarrhea for most patients is self-limiting and will resolve itself over the next several weeks. Sutures removed in clinic today without difficulty and replaced with Steri-Strips. Provided with return instructions for any signs of infection or herniation as well as failure of resolution diarrhea. Patient voiced understanding of all the clinic on an as-needed basis.     32, MD  FACS General Surgeon  07/07/2015,11:12 AM

## 2015-07-08 ENCOUNTER — Telehealth: Payer: Self-pay | Admitting: General Surgery

## 2015-07-08 NOTE — Telephone Encounter (Signed)
Patient called back at this time and states that he has pulled the suture himself.

## 2015-07-08 NOTE — Telephone Encounter (Signed)
Patient was seen yesterday by Dr Tonita Cong. Patient stated that he had his sutures removed however he states there is one left in his belly button. Please call patient with advise.

## 2015-07-16 ENCOUNTER — Telehealth: Payer: Self-pay

## 2015-07-16 NOTE — Telephone Encounter (Signed)
Patient's disability form was filled out and faxed to [phone] per patient's wife request.

## 2016-09-21 IMAGING — NM NM HEPATO W/GB/PHARM/[PERSON_NAME]
3 series · 18 of 18 positions shown · non-contrast
Comparison: None.

CLINICAL DATA: Right upper quadrant pain with nausea and
intermittent vomiting for approximately 4 months

EXAM:
NUCLEAR MEDICINE HEPATOBILIARY IMAGING WITH GALLBLADDER EF
Views:  Anterior right upper quadrant
RADIOPHARMACEUTICALS:  5.20 mCi Tc-YYm Choletec IV

[Series 1000: hepatobiliary dynamic · 9.59mm/px · 6 of 60 frames shown]
[frame 6/60]
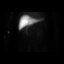
[frame 16/60]
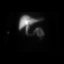
[frame 26/60]
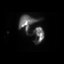
[frame 36/60]
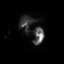
[frame 46/60]
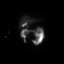
[frame 56/60]
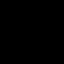

[Series 1000: gallbladder ef dynamic (results) · 4.80mm/px · 6 of 120 frames shown]
[frame 11/120]
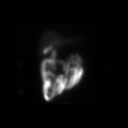
[frame 31/120]
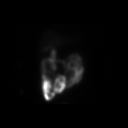
[frame 51/120]
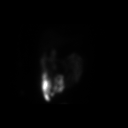
[frame 71/120]
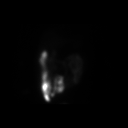
[frame 91/120]
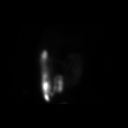
[frame 111/120]
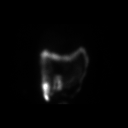

[Series 1000: gallbladder ef dynamic · 4.80mm/px · 6 of 120 frames shown]
[frame 11/120]
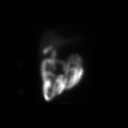
[frame 31/120]
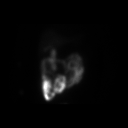
[frame 51/120]
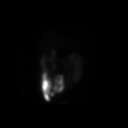
[frame 71/120]
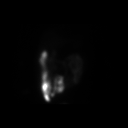
[frame 91/120]
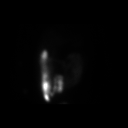
[frame 111/120]
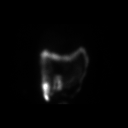

[18 of 18 positions shown; findings below may reference images not displayed]

FINDINGS: Liver uptake of radiotracer is prompt and homogeneous. There is
prompt visualization of gallbladder and small bowel, indicating
patency of the cystic and common bile ducts. A weight based dose,
1.53 mcg, of CCK was administered intravenously with calculation of
the computer generated ejection fraction of radiotracer from the
gallbladder. The patient experienced abdominal tightness sensation
with the CCK administration. The computer generated ejection
fraction of radiotracer from the gallbladder is normal at 99%,
normal greater than 38%.

)
IMPRESSION: Normal ejection fraction of radiotracer from the gallbladder. The
patient did experience clinical symptoms with the CCK
administration. Cystic and common bile ducts are patent as is
evidenced by visualization of gallbladder and small bowel.

## 2017-06-18 ENCOUNTER — Ambulatory Visit: Payer: Self-pay | Admitting: Adult Health

## 2017-06-18 ENCOUNTER — Encounter: Payer: Self-pay | Admitting: Adult Health

## 2017-06-18 VITALS — BP 140/90 | HR 72 | Temp 98.7°F | Wt 174.0 lb

## 2017-06-18 DIAGNOSIS — J028 Acute pharyngitis due to other specified organisms: Secondary | ICD-10-CM

## 2017-06-18 DIAGNOSIS — J019 Acute sinusitis, unspecified: Secondary | ICD-10-CM | POA: Insufficient documentation

## 2017-06-18 DIAGNOSIS — J029 Acute pharyngitis, unspecified: Secondary | ICD-10-CM

## 2017-06-18 LAB — POCT RAPID STREP A (OFFICE): RAPID STREP A SCREEN: NEGATIVE

## 2017-06-18 MED ORDER — AMOXICILLIN-POT CLAVULANATE ER 1000-62.5 MG PO TB12: 2.0000 | ORAL_TABLET | Freq: Two times a day (BID) | ORAL | 0 refills | Status: DC

## 2017-06-18 MED ORDER — PREDNISONE 10 MG (21) PO TBPK: ORAL_TABLET | ORAL | 0 refills | Status: DC

## 2017-06-18 MED ORDER — PREDNISONE 10 MG (21) PO TBPK: ORAL_TABLET | ORAL | 1 refills | Status: DC

## 2017-06-18 NOTE — Progress Notes (Addendum)
Subjective:     Patient ID: Tony Parsons, male   DOB: 25-May-1985, 32 y.o.   MRN: [id number]  HPI   Allergies  Allergen Reactions  . Oxycodone Other (See Comments)    Keeps pt up all night  . Codeine Itching    Blood pressure 140/90, pulse 72, temperature 98.7 F (37.1 C), weight 174 lb (78.9 kg), SpO2 99 %.  Patient is a 32 year old male in no acute distress who presents with sinus congestion and pressure, sorethroat and painful swallowing. Taking Zyrtec daily. Ear pain right. Reports history of sinus infections in past, was on Amoxicillin months ago  without much improvement. Sinus congestion last 12 days.  He has not seen ENT- due to cost and no insurance.  Mild fatigue.  No temperature taken at home feels as if has had mild temperature at times.   Patient  denies any  body aches ,chills, rash, chest pain, shortness of breath, nausea, vomiting, or diarrhea.   Review of Systems  Constitutional: Positive for activity change (mild fatigue ) and appetite change. Negative for chills, diaphoresis, fatigue, fever and unexpected weight change.  HENT: Positive for congestion, postnasal drip, rhinorrhea, sinus pressure, sneezing, sore throat and trouble swallowing (able to swallow liquids and food " hurts"). Negative for dental problem, drooling, ear discharge, ear pain, facial swelling, hearing loss, mouth sores, nosebleeds, sinus pain, tinnitus and voice change.   Eyes: Negative.   Respiratory: Negative.   Cardiovascular: Negative.   Gastrointestinal: Negative.   Endocrine: Negative.   Genitourinary: Negative.   Musculoskeletal: Negative.   Skin: Negative.   Allergic/Immunologic: Positive for environmental allergies.  Neurological: Negative.   Hematological: Negative.   Psychiatric/Behavioral: Negative.        Objective:   Physical Exam  Constitutional: He is oriented to person, place, and time. Vital signs are normal. He appears well-developed and well-nourished. He is active.   Non-toxic appearance. He does not have a sickly appearance. He does not appear ill. No distress.  Patient is alert and oriented and responsive to questions Engages in eye contact with provider. Speaks in full sentences without any pauses without any shortness of breath.    HENT:  Head: Normocephalic and atraumatic.  Right Ear: Hearing, external ear and ear canal normal. Tympanic membrane is erythematous. Tympanic membrane is not perforated. A middle ear effusion is present. No decreased hearing is noted.  Left Ear: Hearing, external ear and ear canal normal. Tympanic membrane is not perforated and not erythematous. A middle ear effusion is present. No decreased hearing is noted.  Nose: Mucosal edema and rhinorrhea present. Right sinus exhibits maxillary sinus tenderness. Right sinus exhibits no frontal sinus tenderness. Left sinus exhibits maxillary sinus tenderness. Left sinus exhibits no frontal sinus tenderness.  Mouth/Throat: Uvula is midline and mucous membranes are normal. Mucous membranes are not pale, not dry and not cyanotic. Uvula swelling (mild) present. Oropharyngeal exudate (bilateral tonisls with erythema and exudate. Mild enlargement bilateral  tonsil) and posterior oropharyngeal erythema present. No posterior oropharyngeal edema.  Eyes: Pupils are equal, round, and reactive to light. Conjunctivae, EOM and lids are normal. Right eye exhibits no discharge. Left eye exhibits no discharge. No scleral icterus.  Neck: Trachea normal, normal range of motion, full passive range of motion without pain and phonation normal. Neck supple. No JVD present. No tracheal tenderness present. No tracheal deviation present. No Brudzinski's sign noted. No thyroid mass and no thyromegaly present.  Cardiovascular: Normal rate, regular rhythm, normal heart sounds and intact  distal pulses. Exam reveals no gallop and no friction rub.  No murmur heard. Pulmonary/Chest: Effort normal and breath sounds normal. No  accessory muscle usage or stridor. No apnea, no tachypnea and no bradypnea. No respiratory distress. He has no decreased breath sounds. He has no wheezes. He has no rales. He exhibits no tenderness.  Abdominal: Soft. Bowel sounds are normal.  Musculoskeletal: Normal range of motion.  Patient moves on and off of exam table and in room without difficulty. Gait is normal in hall and in room. Patient is oriented to person place time and situation. Patient answers questions appropriately and engages in conversation.   Lymphadenopathy:       Head (right side): No submental, no submandibular, no tonsillar, no preauricular, no posterior auricular and no occipital adenopathy present.       Head (left side): No submental, no submandibular, no tonsillar, no preauricular and no posterior auricular adenopathy present.    He has cervical adenopathy.       Right cervical: Superficial cervical adenopathy present. No deep cervical and no posterior cervical adenopathy present.      Left cervical: Superficial cervical adenopathy present. No deep cervical and no posterior cervical adenopathy present.    He has no axillary adenopathy.  Mild adenopathy small bilaterally non tender, mobile, soft nodes palpated.   Neurological: He is alert and oriented to person, place, and time. He displays normal reflexes. No cranial nerve deficit. He exhibits normal muscle tone. Coordination normal.  Skin: Skin is warm, dry and intact. No rash noted. He is not diaphoretic. No cyanosis or erythema. No pallor. Nails show no clubbing.  Psychiatric: He has a normal mood and affect. His speech is normal and behavior is normal. Judgment and thought content normal. Cognition and memory are normal.  Vitals reviewed.      Assessment:     Sore throat - Plan: POCT rapid strep A  Pharyngitis due to other organism  Acute sinusitis, recurrence not specified, unspecified location      Plan:   Orders Placed This Encounter  Procedures   . POCT rapid strep A   Results for orders placed or performed in visit on 06/18/17 (from the past 48 hour(s))  POCT rapid strep A     Status: Normal   Collection Time: 06/18/17 10:21 AM  Result Value Ref Range   Rapid Strep A Screen Negative Negative   Meds ordered this encounter  Medications  . amoxicillin-clavulanate (AUGMENTIN XR) 1000-62.5 MG 12 hr tablet    Sig: Take 2 tablets by mouth 2 (two) times daily.    Dispense:  40 tablet    Refill:  0  . predniSONE (STERAPRED UNI-PAK 21 TAB) 10 MG (21) TBPK tablet    Sig: By mouth Take 6 tablets on day 1, Take 5 tablets day 2 Take 4 tablets day 3 Take 3 tablets day 4 Take 2 tablets day five 5 Take 1 tablet day    Dispense:  21 tablet    Refill:  0   Spoke with Rickey Barbara D [facility] [address] to cancel refill- only one ordered- Prednisone dose pack.   Motrin 600 mg every  8 hours for throat  pain.   Continue Zyrtec daily per package instructions.  Negative strep throat test.   Reviewed [facility] handout. Advised follow up with PCP and/ENT if symptoms persist or return.  Discussed follow up with PCP for blood pressure and monitoring parameters.   Discussed hydration.  Advised patient call the office or  your primary care doctor for an appointment if no improvement within 72 hours or if any symptoms change or worsen at any time  Advised ER or urgent Care if after hours or on weekend. Call 911 for emergency symptoms at any time.Patinet verbalized understanding of all instructions given/reviewed and treatment plan and has no further questions or concerns at this time.    Patient verbalized understanding of all instructions given and denies any further questions at this time.

## 2017-06-18 NOTE — Patient Instructions (Addendum)
Allergic Rhinitis, Adult Allergic rhinitis is an allergic reaction that affects the mucous membrane inside the nose. It causes sneezing, a runny or stuffy nose, and the feeling of mucus going down the back of the throat (postnasal drip). Allergic rhinitis can be mild to severe. There are two types of allergic rhinitis:  Seasonal. This type is also called hay fever. It happens only during certain seasons.  Perennial. This type can happen at any time of the year.  What are the causes? This condition happens when the body's defense system (immune system) responds to certain harmless substances called allergens as though they were germs.  Seasonal allergic rhinitis is triggered by pollen, which can come from grasses, trees, and weeds. Perennial allergic rhinitis may be caused by:  House dust mites.  Pet dander.  Mold spores.  What are the signs or symptoms? Symptoms of this condition include:  Sneezing.  Runny or stuffy nose (nasal congestion).  Postnasal drip.  Itchy nose.  Tearing of the eyes.  Trouble sleeping.  Daytime sleepiness.  How is this diagnosed? This condition may be diagnosed based on:  Your medical history.  A physical exam.  Tests to check for related conditions, such as: ? Asthma. ? Pink eye. ? Ear infection. ? Upper respiratory infection.  Tests to find out which allergens trigger your symptoms. These may include skin or blood tests.  How is this treated? There is no cure for this condition, but treatment can help control symptoms. Treatment may include:  Taking medicines that block allergy symptoms, such as antihistamines. Medicine may be given as a shot, nasal spray, or pill.  Avoiding the allergen.  Desensitization. This treatment involves getting ongoing shots until your body becomes less sensitive to the allergen. This treatment may be done if other treatments do not help.  If taking medicine and avoiding the allergen does not work, new,  stronger medicines may be prescribed.  Follow these instructions at home:  Find out what you are allergic to. Common allergens include smoke, dust, and pollen.  Avoid the things you are allergic to. These are some things you can do to help avoid allergens: ? Replace carpet with wood, tile, or vinyl flooring. Carpet can trap dander and dust. ? Do not smoke. Do not allow smoking in your home. ? Change your heating and air conditioning filter at least once a month. ? During allergy season:  Keep windows closed as much as possible.  Plan outdoor activities when pollen counts are lowest. This is usually during the evening hours.  When coming indoors, change clothing and shower before sitting on furniture or bedding.  Take over-the-counter and prescription medicines only as told by your health care provider.  Keep all follow-up visits as told by your health care provider. This is important. Contact a health care provider if:  You have a fever.  You develop a persistent cough.  You make whistling sounds when you breathe (you wheeze).  Your symptoms interfere with your normal daily activities. Get help right away if:  You have shortness of breath. Summary  This condition can be managed by taking medicines as directed and avoiding allergens.  Contact your health care provider if you develop a persistent cough or fever.  During allergy season, keep windows closed as much as possible. This information is not intended to replace advice given to you by your health care provider. Make sure you discuss any questions you have with your health care provider. Document Released: 11/22/2000 Document Revised: 04/06/2016  Document Reviewed: 04/06/2016 Elsevier Interactive Patient Education  2018 [employer]. Sinusitis, Adult Sinusitis is soreness and inflammation of your sinuses. Sinuses are hollow spaces in the bones around your face. They are located:  Around your eyes.  In the middle of  your forehead.  Behind your nose.  In your cheekbones.  Your sinuses and nasal passages are lined with a stringy fluid (mucus). Mucus normally drains out of your sinuses. When your nasal tissues get inflamed or swollen, the mucus can get trapped or blocked so air cannot flow through your sinuses. This lets bacteria, viruses, and funguses grow, and that leads to infection. Follow these instructions at home: Medicines  Take, use, or apply over-the-counter and prescription medicines only as told by your doctor. These may include nasal sprays.  If you were prescribed an antibiotic medicine, take it as told by your doctor. Do not stop taking the antibiotic even if you start to feel better. Hydrate and Humidify  Drink enough water to keep your pee (urine) clear or pale yellow.  Use a cool mist humidifier to keep the humidity level in your home above 50%.  Breathe in steam for 10-15 minutes, 3-4 times a day or as told by your doctor. You can do this in the bathroom while a hot shower is running.  Try not to spend time in cool or dry air. Rest  Rest as much as possible.  Sleep with your head raised (elevated).  Make sure to get enough sleep each night. General instructions  Put a warm, moist washcloth on your face 3-4 times a day or as told by your doctor. This will help with discomfort.  Wash your hands often with soap and water. If there is no soap and water, use hand sanitizer.  Do not smoke. Avoid being around people who are smoking (secondhand smoke).  Keep all follow-up visits as told by your doctor. This is important. Contact a doctor if:  You have a fever.  Your symptoms get worse.  Your symptoms do not get better within 10 days. Get help right away if:  You have a very bad headache.  You cannot stop throwing up (vomiting).  You have pain or swelling around your face or eyes.  You have trouble seeing.  You feel confused.  Your neck is stiff.  You have  trouble breathing. This information is not intended to replace advice given to you by your health care provider. Make sure you discuss any questions you have with your health care provider. Document Released: 08/16/2007 Document Revised: 10/24/2015 Document Reviewed: 12/23/2014 Elsevier Interactive Patient Education  2018 [employer] Pharyngitis Pharyngitis is a sore throat (pharynx). There is redness, pain, and swelling of your throat. Follow these instructions at home:  Drink enough fluids to keep your pee (urine) clear or pale yellow.  Only take medicine as told by your doctor. ? You may get sick again if you do not take medicine as told. Finish your medicines, even if you start to feel better. ? Do not take aspirin.  Rest.  Rinse your mouth (gargle) with salt water ( tsp of salt per 1 qt of water) every 1-2 hours. This will help the pain.  If you are not at risk for choking, you can suck on hard candy or sore throat lozenges. Contact a doctor if:  You have large, tender lumps on your neck.  You have a rash.  You cough up green, yellow-brown, or bloody spit. Get help right away if:  You have a stiff neck.  You drool or cannot swallow liquids.  You throw up (vomit) or are not able to keep medicine or liquids down.  You have very bad pain that does not go away with medicine.  You have problems breathing (not from a stuffy nose). This information is not intended to replace advice given to you by your health care provider. Make sure you discuss any questions you have with your health care provider. Document Released: 08/16/2007 Document Revised: 08/05/2015 Document Reviewed: 11/04/2012 Elsevier Interactive Patient Education  2017 [employer].

## 2017-06-20 ENCOUNTER — Telehealth: Payer: Self-pay | Admitting: Emergency Medicine

## 2017-06-20 NOTE — Telephone Encounter (Signed)
Left message follow up call from visit with [facility]. 

## 2018-05-29 ENCOUNTER — Telehealth: Payer: BLUE CROSS/BLUE SHIELD | Admitting: Family

## 2018-05-29 DIAGNOSIS — R6889 Other general symptoms and signs: Secondary | ICD-10-CM

## 2018-05-29 DIAGNOSIS — R0602 Shortness of breath: Secondary | ICD-10-CM

## 2018-05-29 DIAGNOSIS — R509 Fever, unspecified: Secondary | ICD-10-CM

## 2018-05-29 NOTE — Progress Notes (Signed)
Based on what you shared with me, I feel that you are considered high risk for Corona virus virus because of a known exposure, fever, shortness of breath and cough.  You should proceed to our testing site at [address] [place] [address].   I have placed an order for you to have the cornoavirus (COVID19) test done.  - You will be tested for (COVID-19) and discharged home on quarantine except to seek medical care if symptoms worsen, and asked to  - Stay home and avoid contact with others until you get your results (4-5 days)  - Avoid travel on public transportation if possible (such as bus, train, or airplane)  Continue to monitor at home and seek medical attention if your symptoms worsen.  If you are having a medical emergency, call 911.    Please review the forms below as these are required. PRINT, sign and complete and bring with you if possible to the testing site.     Person Under Monitoring Name: Tony Parsons  Location: [address] [place] [address]   CORONAVIRUS DISEASE 2019 (COVID-19) Guidance for Persons Under Investigation You are being tested for the virus that causes coronavirus disease 2019 (COVID-19). Public health actions are necessary to ensure protection of your health and the health of others, and to prevent further spread of infection. COVID-19 is caused by a virus that can cause symptoms, such as fever, cough, and shortness of breath. The primary transmission from person to person is by coughing or sneezing. On April 11, 2018, the World Health Organization announced a [employer] Emergency of International Concern and on April 12, 2018 the [place]. Department of Health and Human Services declared a public health emergency. If the virus that causesCOVID-19 spreads in the community, it could have severe public health consequences.  As a person under investigation for COVID-19, the [employer] of Health and [employer] of [employer] advises you to adhere to the following guidance until your test results are reported to you. If your test result is positive, you will receive additional information from your provider and your local health department at that time.   Remain at home until you are cleared by your health provider or public health authorities.   Keep a log of visitors to your home using the form provided. Any visitors to your home must be aware of your isolation status.  If you plan to move to a new address or leave the county, notify the local health department in your county.  Call a doctor or seek care if you have an urgent medical need. Before seeking medical care, call ahead and get instructions from the provider before arriving at the medical office, clinic or hospital. Notify them that you are being tested for the virus that causes COVID-19 so arrangements can be made, as necessary, to prevent transmission to others in the healthcare setting. Next, notify the local health department in your county.  If a medical emergency arises and you need to call 911, inform the first responders that you are being tested for the virus that causes COVID-19. Next, notify the local health department in your county.  Adhere to all guidance set forth by the [facility] of [employer] for [facility] of patients that is based on guidance from the Center for Disease Control and Prevention with suspected or confirmed COVID-19. It is provided with this guidance for Persons Under Investigation.  Your health and the  health of our community are our top priorities. Public Health officials remain available to provide assistance and counseling to you about COVID-19 and compliance with this guidance.  Provider: ____________________________________________________________ Date: ______/_____/_________  By signing below, you acknowledge that you have read and agree to comply with this Guidance for Persons Under  Investigation. ______________________________________________________________ Date: ______/_____/_________  WHO DO I CALL? You can find a list of local health departments here: [email or web address] Health Department: ____________________________________________________________________ Contact Name: ________________________________________________________________________ Telephone: ___________________________________________________________________________  Nedra Hai, Division of Public Health, Communicable Disease Branch COVID-19 Guidance for Persons Under Investigation May 18, 2018   Person Under Monitoring Name: Tony Parsons  Location: [address] [place] [address]   Record here the list of visitors to your home since you became ill with respiratory symptoms that led you to consult a health provider:  Visitor Name Date Time In Time Out Did this person come within 6 feet of you? Indicate Y or N Relationship to Person Under Monitoring Phone number Comments   ___/____/____ __:__ AM/PM __:__ AM/PM       ___/____/____ __:__ AM/PM __:__ AM/PM       ___/____/____ __:__ AM/PM __:__ AM/PM       ___/____/____ __:__ AM/PM __:__ AM/PM       ___/____/____ __:__ AM/PM __:__ AM/PM       ___/____/____ __:__ AM/PM __:__ AM/PM       ___/____/____ __:__ AM/PM __:__ AM/PM       ___/____/____ __:__ AM/PM __:__ AM/PM       ___/____/____ __:__ AM/PM __:__ AM/PM       ___/____/____ __:__ AM/PM __:__ AM/PM       ___/____/____ __:__ AM/PM __:__ AM/PM       ___/____/____ __:__ AM/PM __:__ AM/PM       ___/____/____ __:__ AM/PM __:__ AM/PM       ___/____/____ __:__ AM/PM __:__ AM/PM       Nedra Hai, Division of Public Health, Communicable Disease Branch

## 2018-05-29 NOTE — Progress Notes (Signed)
E-Visit for Corona Virus Screening Based on your current symptoms, it seems like  that your symptoms could be related to the Amber virus.   Very important that you self isolate yourself for the next 14 days and avoid all contact with anyone over the age of 40 years or older!!!   Lots of rest, force fluids, good hand hygiene, and tylenol or motrin as needed.   Coronavirus disease 2019 (COVID-19) is a respiratory illness that can spread from person to person. The virus that causes COVID-19 is a new virus that was first identified in the country of [place] but is now found in multiple other countries and has spread to the [place].  Symptoms associated with the virus are mild to severe fever, cough, and shortness of breath. There is currently no vaccine to protect against COVID-19, and there is no specific antiviral treatment for the virus.  It is vitally important that if you feel that you have an infection such as this virus or any other virus that you stay home and away from places where you may spread it to others.  Currently, not all patients are being tested. If the symptoms are mild and there is not a known exposure, performing the test is not indicated.   Reduce your risk of any infection by using the same precautions used for avoiding the common cold or flu:  Marland Kitchen Wash your hands often with soap and warm water for at least 20 seconds.  If soap and water are not readily available, use an alcohol-based hand sanitizer with at least 60% alcohol.  . If coughing or sneezing, cover your mouth and nose by coughing or sneezing into the elbow areas of your shirt or coat, into a tissue or into your sleeve (not your hands). . Avoid shaking hands with others and consider head nods or verbal greetings only. . Avoid touching your eyes, nose, or mouth with unwashed hands.  . Avoid close contact with people who are sick. . Avoid places or events with large numbers of people in one location, like concerts or  sporting events. . Carefully consider travel plans you have or are making. . If you are planning any travel outside or inside the [place], visit the CDC's Travelers' Health webpage for the latest health notices. . If you have some symptoms but not all symptoms, continue to monitor at home and seek medical attention if your symptoms worsen. . If you are having a medical emergency, call 911.  HOME CARE . Only take medications as instructed by your medical team. . Drink plenty of fluids and get plenty of rest. . A steam or ultrasonic humidifier can help if you have congestion.   GET HELP RIGHT AWAY IF: . You develop worsening fever. . You become short of breath . You cough up blood. . Your symptoms become more severe MAKE SURE YOU   Understand these instructions.  Will watch your condition.  Will get help right away if you are not doing well or get worse.  Your e-visit answers were reviewed by a board certified advanced clinical practitioner to complete your personal care plan.  Depending on the condition, your plan could have included both over the counter or prescription medications.  If there is a problem please reply once you have received a response from your provider. Your safety is important to [place].  If you have drug allergies check your prescription carefully.    You can use MyChart to ask questions about today's visit,  request a non-urgent call back, or ask for a work or school excuse for 24 hours related to this e-Visit. If it has been greater than 24 hours you will need to follow up with your provider, or enter a new e-Visit to address those concerns. You will get an e-mail in the next two days asking about your experience.  I hope that your e-visit has been valuable and will speed your recovery. Thank you for using e-visits.

## 2018-05-30 ENCOUNTER — Telehealth: Payer: Self-pay | Admitting: Emergency Medicine

## 2018-06-03 LAB — NOVEL CORONAVIRUS, NAA: SARS-COV-2, NAA: NOT DETECTED

## 2018-12-25 ENCOUNTER — Other Ambulatory Visit: Payer: Self-pay | Admitting: *Deleted

## 2018-12-25 DIAGNOSIS — Z20822 Contact with and (suspected) exposure to covid-19: Secondary | ICD-10-CM

## 2018-12-27 LAB — NOVEL CORONAVIRUS, NAA: SARS-CoV-2, NAA: NOT DETECTED

## 2020-04-16 ENCOUNTER — Other Ambulatory Visit: Payer: BLUE CROSS/BLUE SHIELD

## 2020-04-18 ENCOUNTER — Other Ambulatory Visit: Payer: BLUE CROSS/BLUE SHIELD

## 2022-12-25 ENCOUNTER — Telehealth: Payer: Self-pay

## 2022-12-25 NOTE — Telephone Encounter (Signed)
Received referral from Marvell Fuller FNP at [facility] for snoring, somnolence, fatigue and essential hypertension - req. Sleep eval. Placed in sleep mailbox

## 2023-01-23 ENCOUNTER — Encounter: Payer: Self-pay | Admitting: *Deleted

## 2023-01-24 ENCOUNTER — Encounter: Payer: Self-pay | Admitting: Neurology

## 2023-01-24 ENCOUNTER — Ambulatory Visit: Payer: BC Managed Care – PPO | Admitting: Neurology

## 2023-01-24 VITALS — BP 144/94 | HR 108 | Ht 71.0 in | Wt 197.2 lb

## 2023-01-24 DIAGNOSIS — R351 Nocturia: Secondary | ICD-10-CM

## 2023-01-24 DIAGNOSIS — R0681 Apnea, not elsewhere classified: Secondary | ICD-10-CM

## 2023-01-24 DIAGNOSIS — R0683 Snoring: Secondary | ICD-10-CM | POA: Diagnosis not present

## 2023-01-24 DIAGNOSIS — Z9189 Other specified personal risk factors, not elsewhere classified: Secondary | ICD-10-CM | POA: Diagnosis not present

## 2023-01-24 DIAGNOSIS — R519 Headache, unspecified: Secondary | ICD-10-CM

## 2023-01-24 DIAGNOSIS — G4719 Other hypersomnia: Secondary | ICD-10-CM

## 2023-01-24 DIAGNOSIS — Z820 Family history of epilepsy and other diseases of the nervous system: Secondary | ICD-10-CM

## 2023-01-24 DIAGNOSIS — E663 Overweight: Secondary | ICD-10-CM

## 2023-01-24 NOTE — Patient Instructions (Signed)

## 2023-01-24 NOTE — Progress Notes (Signed)
Subjective:    Patient ID: Tony Parsons is a 37 y.o. male.  HPI    Huston Foley, MD, PhD [facility] [address], [address] [address] [place], [place] [address]  Dear Marcelino Duster,   I saw your patient, Tony Parsons, upon your kind request in my sleep clinic today for initial consultation of his sleep disorder, in particular, concern for underlying obstructive sleep apnea.  The patient is unaccompanied today.  As you know, Tony Parsons is a 37 year old male with an underlying medical history of arthritis, depression, anxiety, hemochromatosis, kidney stone, reflux disease, and overweight state, who reports snoring and excessive daytime somnolence, as well as witnessed apneas per wife's report.  His Epworth sleepiness score is 13 out of 24, fatigue severity score is 59 out of 63.  He does not typically wake up fully rested.  He has had morning headaches.  He has nocturia about once or twice per average night.  His mom and maternal aunt have sleep apnea.  His maternal grandmother had a diagnosis of narcolepsy.  Lives with his wife and 2 children, ages 60 and 70.  He drinks caffeine in the form of soda, about 2 cans/day and occasional tea.  He is a non-smoker and drinks alcohol occasionally.  He works as a Curator.  At times around 10 and rise time around 6.  He has had significant swelling of the uvula first thing in the morning and wakes up with a sore throat.  He has seen ENT recently.  He  was advised to start a PPI for reflux.  He has had slow weight gain over time, in the past 10 years.  I reviewed your office note from 12/22/2022.  His Past Medical History Is Significant For: Past Medical History:  Diagnosis Date   Anxiety    Arthritis    Rheumatoid - scheduling to see rheumatologist   Depression    GERD (gastroesophageal reflux disease)    Headache    migraines - 2x/week   Hemochromatosis    Inguinal hernia    Kidney stone 08/12/2011    His Past Surgical History  Is Significant For: Past Surgical History:  Procedure Laterality Date   CHOLECYSTECTOMY N/A 07/01/2015   Procedure: LAPAROSCOPIC CHOLECYSTECTOMY WITH CHOLANGIOGRAM;  Surgeon: Tiney Rouge III, MD;  Location: [facility];  Service: General;  Laterality: N/A;   ESOPHAGOGASTRODUODENOSCOPY (EGD) WITH PROPOFOL N/A 06/24/2015   Procedure: ESOPHAGOGASTRODUODENOSCOPY (EGD) WITH PROPOFOL;  Surgeon: Midge Minium, MD;  Location: [facility];  Service: Endoscopy;  Laterality: N/A;   INGUINAL HERNIA REPAIR  12/25/2011   Procedure: HERNIA REPAIR INGUINAL ADULT;  Surgeon: Robyne Askew, MD;  Location: [facility] OR;  Service: General;  Laterality: Right;   UPPER GASTROINTESTINAL ENDOSCOPY  1999   WISDOM TOOTH EXTRACTION  2005    His Family History Is Significant For: Family History  Problem Relation Age of Onset   Diabetes Mother    Sleep apnea Mother    Hypertension Father    Arthritis Father    Melanoma Sister    Sleep apnea Maternal Aunt    Melanoma Maternal Grandmother    Narcolepsy Maternal Grandmother     His Social History Is Significant For: Social History   Socioeconomic History   Marital status: Married    Spouse name: Not on file   Number of children: Not on file   Years of education: Not on file   Highest education level: Not on file  Occupational History  Not on file  Tobacco Use   Smoking status: Never   Smokeless tobacco: Never   Tobacco comments:    no passive smokers in home  Substance and Sexual Activity   Alcohol use: Not Currently    Comment: occ   Drug use: No   Sexual activity: Yes  Other Topics Concern   Not on file  Social History Narrative   Not on file   Social Determinants of Health   Financial Resource Strain: Not on file  Food Insecurity: Low Risk  (01/23/2023)   Received from [facility]   Hunger Vital Sign    Worried About Running Out of Food in the Last Year: Never true    Ran Out of Food in the Last Year: Never true  Transportation Needs: No  Transportation Needs (01/23/2023)   Received from [facility]    In the past 12 months, has lack of reliable transportation kept you from medical appointments, meetings, work or from getting things needed for daily living? : No  Physical Activity: Not on file  Stress: Not on file  Social Connections: Not on file    His Allergies Are:  Allergies  Allergen Reactions   Oxycodone Other (See Comments)    Keeps pt up all night   Codeine Itching  :   His Current Medications Are:  Outpatient Encounter Medications as of 01/24/2023  Medication Sig   acetaminophen (TYLENOL) 500 MG tablet Take 1,000 mg by mouth every 6 (six) hours as needed. Reported on 07/07/2015   DULoxetine (CYMBALTA) 60 MG capsule Take 60 mg by mouth daily.   omeprazole (PRILOSEC) 40 MG capsule Take 40 mg by mouth daily.   Testosterone Cypionate 200 MG/ML SOLN Inject 100 mg as directed once a week.   No facility-administered encounter medications on file as of 01/24/2023.  :   Review of Systems:  Out of a complete 14 point review of systems, all are reviewed and negative with the exception of these symptoms as listed below:   Review of Systems  Neurological:        Pt here for sleep consult  Pt snores,headaches,fatigue,hypertension  Pt denies cpap machine Pt states sleep study 10 years ago     ESS:13 FSS:59      Objective:  Neurological Exam  Physical Exam Physical Examination:   Vitals:   01/24/23 1541  BP: (!) 144/94  Pulse: (!) 108    General Examination: The patient is a very pleasant 37 y.o. male in no acute distress. He appears well-developed and well-nourished and well groomed.   HEENT: Normocephalic, atraumatic, pupils are equal, round and reactive to light, extraocular tracking is good without limitation to gaze excursion or nystagmus noted. Hearing is grossly intact. Face is symmetric with normal facial animation. Speech is clear with no dysarthria noted. There is no  hypophonia. There is no lip, neck/head, jaw or voice tremor. Neck is supple with full range of passive and active motion. There are no carotid bruits on auscultation. Oropharynx exam reveals: mild mouth dryness, good dental hygiene and moderate airway crowding, due to small airway entry enlarged uvula.  Tonsils on the small side, Mallampati class III.  Neck circumference 16 7/8 inches.  Tongue protrudes centrally and palate elevates symmetrically.  Minimal overbite noted.  Chest: Clear to auscultation without wheezing, rhonchi or crackles noted.  Heart: S1+S2+0, regular and normal without murmurs, rubs or gallops noted.   Abdomen: Soft, non-tender and non-distended.  Extremities: There is no pitting  edema in the distal lower extremities bilaterally.   Skin: Warm and dry without trophic changes noted.   Musculoskeletal: exam reveals no obvious joint deformities.   Neurologically:  Mental status: The patient is awake, alert and oriented in all 4 spheres. His immediate and remote memory, attention, language skills and fund of knowledge are appropriate. There is no evidence of aphasia, agnosia, apraxia or anomia. Speech is clear with normal prosody and enunciation. Thought process is linear. Mood is normal and affect is normal.  Cranial nerves II - XII are as described above under HEENT exam.  Motor exam: Normal bulk, strength and tone is noted. There is no obvious action or resting tremor.  Fine motor skills and coordination: grossly intact.  Cerebellar testing: No dysmetria or intention tremor. There is no truncal or gait ataxia.  Sensory exam: intact to light touch in the upper and lower extremities.  Gait, station and balance: He stands easily. No veering to one side is noted. No leaning to one side is noted. Posture is age-appropriate and stance is narrow based. Gait shows normal stride length and normal pace. No problems turning are noted.   Assessment and Plan:  In summary, Tony Parsons is  a very pleasant 37 y.o.-year old male with an underlying medical history of arthritis, depression, anxiety, hemochromatosis, kidney stone, reflux disease, and overweight state, whose history and physical exam are concerning for sleep disordered breathing, particularly obstructive sleep apnea (OSA).  While a laboratory attended sleep study is typically considered "gold standard" for evaluation of sleep disordered breathing, we mutually agreed to proceed with a home sleep test at this time.   I had a long chat with the patient about my findings and the diagnosis of sleep apnea, particularly OSA, its prognosis and treatment options. We talked about medical/conservative treatments, surgical interventions and non-pharmacological approaches for symptom control. I explained, in particular, the risks and ramifications of untreated moderate to severe OSA, especially with respect to developing cardiovascular disease down the road, including congestive heart failure (CHF), difficult to treat hypertension, cardiac arrhythmias (particularly A-fib), neurovascular complications including TIA, stroke and dementia. Even type 2 diabetes has, in part, been linked to untreated OSA. Symptoms of untreated OSA may include (but may not be limited to) daytime sleepiness, nocturia (i.e. frequent nighttime urination), memory problems, mood irritability and suboptimally controlled or worsening mood disorder such as depression and/or anxiety, lack of energy, lack of motivation, physical discomfort, as well as recurrent headaches, especially morning or nocturnal headaches. We talked about the importance of maintaining a healthy lifestyle and striving for healthy weight. I recommended a sleep study at this time. I outlined the differences between a laboratory attended sleep study which is considered more comprehensive and accurate over the option of a home sleep test (HST); the latter may lead to underestimation of sleep disordered breathing in  some instances and does not help with diagnosing upper airway resistance syndrome and is not accurate enough to diagnose primary central sleep apnea typically. I outlined possible surgical and non-surgical treatment options of OSA, including the use of a positive airway pressure (PAP) device (i.e. CPAP, AutoPAP/APAP or BiPAP in certain circumstances), a custom-made dental device (aka oral appliance, which would require a referral to a specialist dentist or orthodontist typically, and is generally speaking not considered for patients with full dentures or edentulous state), upper airway surgical options, such as traditional UPPP (which is not considered a first-line treatment) or the Inspire device (hypoglossal nerve stimulator, which would involve a  referral for consultation with an ENT surgeon, after careful selection, following inclusion criteria - also not first-line treatment). I explained the PAP treatment option to the patient in detail, as this is generally considered first-line treatment.  The patient indicated that he would be willing to try PAP therapy, if the need arises. I explained the importance of being compliant with PAP treatment, not only for insurance purposes but primarily to improve patient's symptoms symptoms, and for the patient's long term health benefit, including to reduce His cardiovascular risks longer-term.    We will pick up our discussion about the next steps and treatment options after testing.  We will keep him posted as to the test results by phone call and/or MyChart messaging where possible.  We will plan to follow-up in sleep clinic accordingly as well.  I answered all his questions today and the patient was in agreement.   I encouraged him to call with any interim questions, concerns, problems or updates or email [place] through MyChart.  Generally speaking, sleep test authorizations may take up to 2 weeks, sometimes less, sometimes longer, the patient is encouraged to get in  touch with [place] if they do not hear back from the sleep lab staff directly within the next 2 weeks.  Thank you very much for allowing me to participate in the care of this nice patient. If I can be of any further assistance to you please do not hesitate to call me at [phone].  Sincerely,   Huston Foley, MD, PhD

## 2023-02-06 ENCOUNTER — Telehealth: Payer: Self-pay | Admitting: Neurology

## 2023-02-06 NOTE — Telephone Encounter (Signed)
Sent mychart message

## 2023-02-12 NOTE — Telephone Encounter (Signed)
HST [employer] Berkley Harvey: [phone] (exp. 02/12/23 to 04/12/23)   He is ready to schedule.

## 2023-02-21 ENCOUNTER — Ambulatory Visit: Payer: BC Managed Care – PPO | Admitting: Neurology

## 2023-02-21 DIAGNOSIS — R519 Headache, unspecified: Secondary | ICD-10-CM

## 2023-02-21 DIAGNOSIS — G4731 Primary central sleep apnea: Secondary | ICD-10-CM

## 2023-02-21 DIAGNOSIS — Z820 Family history of epilepsy and other diseases of the nervous system: Secondary | ICD-10-CM

## 2023-02-21 DIAGNOSIS — E663 Overweight: Secondary | ICD-10-CM

## 2023-02-21 DIAGNOSIS — Z9189 Other specified personal risk factors, not elsewhere classified: Secondary | ICD-10-CM

## 2023-02-21 DIAGNOSIS — R0683 Snoring: Secondary | ICD-10-CM

## 2023-02-21 DIAGNOSIS — R351 Nocturia: Secondary | ICD-10-CM

## 2023-02-21 DIAGNOSIS — G4733 Obstructive sleep apnea (adult) (pediatric): Secondary | ICD-10-CM | POA: Diagnosis not present

## 2023-02-21 DIAGNOSIS — G4719 Other hypersomnia: Secondary | ICD-10-CM

## 2023-02-21 DIAGNOSIS — R0681 Apnea, not elsewhere classified: Secondary | ICD-10-CM

## 2023-03-01 ENCOUNTER — Telehealth: Payer: Self-pay | Admitting: *Deleted

## 2023-03-01 NOTE — Addendum Note (Signed)
Addended by: Huston Foley on: 03/01/2023 04:14 PM   Modules accepted: Orders

## 2023-03-01 NOTE — Procedures (Signed)
[facility]  HOME SLEEP TEST (Watch PAT) REPORT - Mail-out Device  STUDY DATE: 02/26/2023  DOB: 08-31-1985  MRN: [id number]  ORDERING CLINICIAN: Huston Foley, MD, PhD   REFERRING CLINICIAN: Marvell Fuller, NP  CLINICAL INFORMATION/HISTORY: 37 year old male with an underlying medical history of arthritis, depression, anxiety, hemochromatosis, kidney stone, reflux disease, and overweight state, who reports snoring and excessive daytime somnolence, as well as witnessed apneas.   Epworth sleepiness score: 13/24.  BMI: 27.5 kg/m  FINDINGS:   Sleep Summary:   Total Recording Time (hours, min): 7 hours, 33 min  Total Sleep Time (hours, min):  6 hours, 35 min  Percent REM (%):    17.7%   Respiratory Indices:   Calculated pAHI (per hour):  44.9/hour         REM pAHI:    50.1/hour       NREM pAHI: 43.7/hour  Central pAHI: 6.1/hour  Oxygen Saturation Statistics:    Oxygen Saturation (%) Mean: 94%   Minimum oxygen saturation (%):                 84%   O2 Saturation Range (%): 84 - 99%    O2 Saturation (minutes) <=88%: 3.3 min  Pulse Rate Statistics:   Pulse Mean (bpm):    84/min    Pulse Range (60 - 117/min)   IMPRESSION: OSA (obstructive sleep apnea), severe Central Sleep Apnea   RECOMMENDATION:  This home sleep test demonstrates severe obstructive sleep apnea with a total AHI of 44.9/hour and O2 nadir of 84%.  There was a mild central apnea component as well.  Snoring was in the moderate to loud range. Treatment with positive airway pressure is highly recommended. The patient will be advised to proceed with an autoPAP titration/trial at home. A laboratory attended titration study can be considered in the future for optimization of treatment settings and to improve tolerance and compliance, if needed, down the road. Alternative treatment options are limited secondary to the severity of the patient's sleep disordered breathing, but may include  surgical treatment with an implantable hypoglossal nerve stimulator (in carefully selected candidates, meeting criteria).  Concomitant weight loss is recommended (where clinically appropriate). Please note, that untreated obstructive sleep apnea may carry additional perioperative morbidity. Patients with significant obstructive sleep apnea should receive perioperative PAP therapy and the surgeons and particularly the anesthesiologist should be informed of the diagnosis and the severity of the sleep disordered breathing. The patient should be cautioned not to drive, work at heights, or operate dangerous or heavy equipment when tired or sleepy. Review and reiteration of good sleep hygiene measures should be pursued with any patient. Other causes of the patient's symptoms, including circadian rhythm disturbances, an underlying mood disorder, medication effect and/or an underlying medical problem cannot be ruled out based on this test. Clinical correlation is recommended.  The patient and his referring provider will be notified of the test results. The patient will be seen in follow up in sleep clinic at [facility].  I certify that I have reviewed the raw data recording prior to the issuance of this report in accordance with the standards of the American Academy of Sleep Medicine (AASM).    INTERPRETING PHYSICIAN:   Huston Foley, MD, PhD Medical Director, [facility]) Diplomat, ABPN (Neurology and Sleep)   [facility] [address], [address] [place], [place] [address] [phone]

## 2023-03-01 NOTE — Progress Notes (Signed)
See procedure note.

## 2023-03-01 NOTE — Telephone Encounter (Signed)
-----   Message from Huston Foley sent at 03/01/2023  4:14 PM EST ----- Urgent set up requested on PAP therapy, due to severe OSA. Patient referred by PCP, seen by me on 01/24/2023, patient had a HST on 02/26/2023.    Please call and notify the patient that the recent home sleep test showed obstructive sleep apnea in the severe range. I recommend treatment for this in the form of autoPAP, which means, that we don't have to bring him in for a sleep study with CPAP, but will let him start using a so called autoPAP machine at home, through a DME company (of his choice, or as per insurance requirement). The DME representative will fit the patient with a mask of choice, educate him on how to use the machine, how to put the mask on, etc. I have placed an order in the chart. Please send the order to a local DME, talk to patient, send report to referring MD. Please also reinforce the need for compliance with treatment. We will need a FU in sleep clinic for 10 weeks post-PAP set up, please arrange that with me or one of our NPs. Thanks,   Huston Foley, MD, PhD [facility])

## 2023-03-01 NOTE — Telephone Encounter (Signed)
I spoke with the patient and discussed his sleep study results. Pt is amenable to urgent setup on autopap for severe osa. His questions were answered. Will refer to [facility], [place] location which is better for patient. We discussed the insurance compliance requirements which includes using the machine at least 4 hours at night and also being seen by our office for initial follow-up appointment between 30 and 90 days after setup. Pt scheduled for mychart video visit on 05/10/23 at 11:45 am (check in at 1130).   Referral sent to [facility] (urgent). Sleep study report sent to referring provider.

## 2023-03-05 NOTE — Telephone Encounter (Signed)
New, Maryella Shivers, Otilio Jefferson, RN; Alain Honey; Jeris Penta, Luis Llorons Torres; 1 other Received, thank you!

## 2023-04-05 ENCOUNTER — Encounter: Payer: Self-pay | Admitting: Neurology

## 2023-04-05 NOTE — Telephone Encounter (Addendum)
I called pt to let him know that we did change pressure to 7/14. Per Dr. Teofilo Pod recommendation after viewing his DL.  He thanked me for call.   Done in airview.

## 2023-04-05 NOTE — Telephone Encounter (Signed)
Please increase min pressure to 7 cm and max pressure to 14 cm.

## 2023-04-05 NOTE — Telephone Encounter (Signed)
  His SOD 03-29-2023.  Can we adjust pressure?

## 2023-05-09 NOTE — Progress Notes (Unsigned)
 Setup date: 03/29/2023

## 2023-05-10 ENCOUNTER — Telehealth: Payer: BC Managed Care – PPO | Admitting: Adult Health

## 2023-05-10 DIAGNOSIS — G4733 Obstructive sleep apnea (adult) (pediatric): Secondary | ICD-10-CM | POA: Diagnosis not present

## 2023-05-10 NOTE — Progress Notes (Signed)
 PATIENT: Tony Parsons DOB: 12/23/1985  REASON FOR VISIT: follow up HISTORY FROM: patient PRIMARY NEUROLOGIST: Dr. Frances Furbish   Virtual Visit via Video Note  I connected with Tony Parsons on 05/10/23 at 11:45 AM EST by a video enabled telemedicine application located remotely at [facility] and verified that I am speaking with the correct person using two identifiers who was located at their own home in [place].   I discussed the limitations of evaluation and management by telemedicine and the availability of in person appointments. The patient expressed understanding and agreed to proceed.   PATIENT: Tony Parsons DOB: 10-31-1985  REASON FOR VISIT: follow up HISTORY FROM: patient  HISTORY OF PRESENT ILLNESS: Today 05/10/23:  Tony Parsons is a 38 y.o. male with a history of obstructive sleep apnea on CPAP. Returns today for follow-up.  Reports that he feels that he can tell a difference since he has been using the machine.  He states he recently has had the flu so he has found it hard to use.  His download is below          REVIEW OF SYSTEMS: Out of a complete 14 system review of symptoms, the patient complains only of the following symptoms, and all other reviewed systems are negative.  ALLERGIES: Allergies  Allergen Reactions   Oxycodone Other (See Comments)    Keeps pt up all night   Codeine Itching    HOME MEDICATIONS: Outpatient Medications Prior to Visit  Medication Sig Dispense Refill   acetaminophen (TYLENOL) 500 MG tablet Take 1,000 mg by mouth every 6 (six) hours as needed. Reported on 07/07/2015     DULoxetine (CYMBALTA) 60 MG capsule Take 60 mg by mouth daily.     omeprazole (PRILOSEC) 40 MG capsule Take 40 mg by mouth daily.     Testosterone Cypionate 200 MG/ML SOLN Inject 100 mg as directed once a week.     No facility-administered medications prior to visit.    PAST MEDICAL HISTORY: Past Medical History:  Diagnosis Date   Anxiety     Arthritis    Rheumatoid - scheduling to see rheumatologist   Depression    GERD (gastroesophageal reflux disease)    Headache    migraines - 2x/week   Hemochromatosis    Inguinal hernia    Kidney stone 08/12/2011    PAST SURGICAL HISTORY: Past Surgical History:  Procedure Laterality Date   CHOLECYSTECTOMY N/A 07/01/2015   Procedure: LAPAROSCOPIC CHOLECYSTECTOMY WITH CHOLANGIOGRAM;  Surgeon: Tiney Rouge III, MD;  Location: [facility] ORS;  Service: General;  Laterality: N/A;   ESOPHAGOGASTRODUODENOSCOPY (EGD) WITH PROPOFOL N/A 06/24/2015   Procedure: ESOPHAGOGASTRODUODENOSCOPY (EGD) WITH PROPOFOL;  Surgeon: Midge Minium, MD;  Location: [facility];  Service: Endoscopy;  Laterality: N/A;   INGUINAL HERNIA REPAIR  12/25/2011   Procedure: HERNIA REPAIR INGUINAL ADULT;  Surgeon: Robyne Askew, MD;  Location: [facility] OR;  Service: General;  Laterality: Right;   UPPER GASTROINTESTINAL ENDOSCOPY  1999   WISDOM TOOTH EXTRACTION  2005    FAMILY HISTORY: Family History  Problem Relation Age of Onset   Diabetes Mother    Sleep apnea Mother    Hypertension Father    Arthritis Father    Melanoma Sister    Sleep apnea Maternal Aunt    Melanoma Maternal Grandmother    Narcolepsy Maternal Grandmother     SOCIAL HISTORY: Social History   Socioeconomic History   Marital status: Married    Spouse  name: Not on file   Number of children: Not on file   Years of education: Not on file   Highest education level: Not on file  Occupational History   Not on file  Tobacco Use   Smoking status: Never   Smokeless tobacco: Never   Tobacco comments:    no passive smokers in home  Substance and Sexual Activity   Alcohol use: Not Currently    Comment: occ   Drug use: No   Sexual activity: Yes  Other Topics Concern   Not on file  Social History Narrative   Not on file   Social Drivers of Health   Financial Resource Strain: Not on file  Food Insecurity: Low Risk  (01/23/2023)   Received from  [facility]   Hunger Vital Sign    Worried About Running Out of Food in the Last Year: Never true    Ran Out of Food in the Last Year: Never true  Transportation Needs: No Transportation Needs (01/23/2023)   Received from [facility]    In the past 12 months, has lack of reliable transportation kept you from medical appointments, meetings, work or from getting things needed for daily living? : No  Physical Activity: Not on file  Stress: Not on file  Social Connections: Not on file  Intimate Partner Violence: Not on file      PHYSICAL EXAM Generalized: Well developed, in no acute distress   Neurological examination  Mentation: Alert oriented to time, place, history taking. Follows all commands speech and language fluent Cranial nerve II-XII:Extraocular movements were full. Facial symmetry noted.   DIAGNOSTIC DATA (LABS, IMAGING, TESTING) - I reviewed patient records, labs, notes, testing and imaging myself where available.  Lab Results  Component Value Date   WBC 9.7 06/15/2015   HGB 16.4 06/15/2015   HCT 47.7 06/15/2015   MCV 88.1 06/15/2015   PLT 236 06/15/2015      Component Value Date/Time   NA 135 06/15/2015 1001   K 3.7 06/15/2015 1001   CL 104 06/15/2015 1001   CO2 24 06/15/2015 1001   GLUCOSE 105 (H) 06/15/2015 1001   BUN 9 06/15/2015 1001   CREATININE 0.90 06/15/2015 1001   CALCIUM 9.9 06/15/2015 1001   PROT 7.5 06/15/2015 1001   ALBUMIN 4.9 06/15/2015 1001   AST 26 06/15/2015 1001   ALT 32 06/15/2015 1001   ALKPHOS 78 06/15/2015 1001   BILITOT 0.7 06/15/2015 1001   GFRNONAA >60 06/15/2015 1001   GFRAA >60 06/15/2015 1001     ASSESSMENT AND PLAN 38 y.o. year old male  has a past medical history of Anxiety, Arthritis, Depression, GERD (gastroesophageal reflux disease), Headache, Hemochromatosis, Inguinal hernia, and Kidney stone (08/12/2011). here with:  OSA on CPAP  CPAP compliance excellent Residual AHI slightly elevated We  will adjust pressure 7 to 16 cm of water Encouraged patient to continue using CPAP nightly and > 4 hours each night F/U in 1 year or sooner if needed   Butch Penny, MSN, NP-C 05/10/2023, 5:18 AM [facility] [address] [place], [place] [address] [phone]

## 2023-05-16 ENCOUNTER — Encounter: Payer: Self-pay | Admitting: Adult Health

## 2023-05-16 ENCOUNTER — Telehealth: Payer: Self-pay

## 2023-05-17 NOTE — Telephone Encounter (Signed)
 Pressure changed in airview to 7 to 16 (from 7 to 14).

## 2023-08-23 ENCOUNTER — Encounter: Payer: Self-pay | Admitting: *Deleted

## 2023-08-27 ENCOUNTER — Telehealth: Payer: BC Managed Care – PPO | Admitting: Adult Health

## 2023-08-27 DIAGNOSIS — G4733 Obstructive sleep apnea (adult) (pediatric): Secondary | ICD-10-CM | POA: Diagnosis not present

## 2023-08-27 NOTE — Patient Instructions (Signed)
 Continue using CPAP nightly and greater than 4 hours each night We will adjust pressure 9 to 17 cm of water if this does not show any improvement in your apnea will put you in for CPAP titration If your symptoms worsen or you develop new symptoms please let us  know.

## 2023-08-27 NOTE — Progress Notes (Signed)
 PATIENT: Tony Parsons DOB: [birth year]  REASON FOR VISIT: follow up HISTORY FROM: patient PRIMARY NEUROLOGIST: Dr. Omar Bibber   Virtual Visit via Video Note  I connected with Carin Charleston on 08/27/23 at 10:15 AM EDT by a video enabled telemedicine application located remotely at home office and verified that I am speaking with the correct person using two identifiers who was located at their own home in [place].   I discussed the limitations of evaluation and management by telemedicine and the availability of in person appointments. The patient expressed understanding and agreed to proceed.   PATIENT: Tony Parsons DOB: Mar 19, 1985  REASON FOR VISIT: follow up HISTORY FROM: patient  HISTORY OF PRESENT ILLNESS: Today 08/27/23:  Tony Parsons is a 38 y.o. male with a history of OSA on CPAP. Returns today for follow-up.  Reports that CPAP is working well.  Although his download shows that his apnea is still elevated.  We did increase the pressure at the last visit.  He does feel that when he puts the machine on the pressure is not strong enough.  His download is below     05/10/23: Tony Parsons is a 38 y.o. male with a history of obstructive sleep apnea on CPAP. Returns today for follow-up.  Reports that he feels that he can tell a difference since he has been using the machine.  He states he recently has had the flu so he has found it hard to use.  His download is below          REVIEW OF SYSTEMS: Out of a complete 14 system review of symptoms, the patient complains only of the following symptoms, and all other reviewed systems are negative.  ALLERGIES: Allergies  Allergen Reactions   Oxycodone  Other (See Comments)    Keeps pt up all night   Codeine Itching    HOME MEDICATIONS: Outpatient Medications Prior to Visit  Medication Sig Dispense Refill   acetaminophen  (TYLENOL ) 500 MG tablet Take 1,000 mg by mouth every 6 (six) hours as needed. Reported on 07/07/2015     DULoxetine  (CYMBALTA) 60 MG capsule Take 60 mg by mouth daily.     omeprazole (PRILOSEC) 40 MG capsule Take 40 mg by mouth daily.     Testosterone Cypionate 200 MG/ML SOLN Inject 100 mg as directed once a week.     No facility-administered medications prior to visit.    PAST MEDICAL HISTORY: Past Medical History:  Diagnosis Date   Anxiety    Arthritis    Rheumatoid - scheduling to see rheumatologist   Depression    GERD (gastroesophageal reflux disease)    Headache    migraines - 2x/week   Hemochromatosis    Inguinal hernia    Kidney stone 08/12/2011    PAST SURGICAL HISTORY: Past Surgical History:  Procedure Laterality Date   CHOLECYSTECTOMY N/A 07/01/2015   Procedure: LAPAROSCOPIC CHOLECYSTECTOMY WITH CHOLANGIOGRAM;  Surgeon: Rhina Center III, MD;  Location: [facility] ORS;  Service: General;  Laterality: N/A;   ESOPHAGOGASTRODUODENOSCOPY (EGD) WITH PROPOFOL  N/A 06/24/2015   Procedure: ESOPHAGOGASTRODUODENOSCOPY (EGD) WITH PROPOFOL ;  Surgeon: Marnee Sink, MD;  Location: [facility];  Service: Endoscopy;  Laterality: N/A;   INGUINAL HERNIA REPAIR  12/25/2011   Procedure: HERNIA REPAIR INGUINAL ADULT;  Surgeon: Mayme Spearman, MD;  Location: [facility] OR;  Service: General;  Laterality: Right;   UPPER GASTROINTESTINAL ENDOSCOPY  1999   WISDOM TOOTH EXTRACTION  2005    FAMILY HISTORY:  Family History  Problem Relation Age of Onset   Diabetes Mother    Sleep apnea Mother    Hypertension Father    Arthritis Father    Melanoma Sister    Sleep apnea Maternal Aunt    Melanoma Maternal Grandmother    Narcolepsy Maternal Grandmother     SOCIAL HISTORY: Social History   Socioeconomic History   Marital status: Married    Spouse name: Not on file   Number of children: Not on file   Years of education: Not on file   Highest education level: Not on file  Occupational History   Not on file  Tobacco Use   Smoking status: Never   Smokeless tobacco: Never   Tobacco comments:    no passive  smokers in home  Substance and Sexual Activity   Alcohol use: Not Currently    Comment: occ   Drug use: No   Sexual activity: Yes  Other Topics Concern   Not on file  Social History Narrative   Not on file   Social Drivers of Health   Financial Resource Strain: Not on file  Food Insecurity: Low Risk  (01/23/2023)   Received from [facility]   Hunger Vital Sign    Worried About Running Out of Food in the Last Year: Never true    Ran Out of Food in the Last Year: Never true  Transportation Needs: No Transportation Needs (01/23/2023)   Received from [facility]    In the past 12 months, has lack of reliable transportation kept you from medical appointments, meetings, work or from getting things needed for daily living? : No  Physical Activity: Not on file  Stress: Not on file  Social Connections: Not on file  Intimate Partner Violence: Not on file      PHYSICAL EXAM Generalized: Well developed, in no acute distress   Neurological examination  Mentation: Alert oriented to time, place, history taking. Follows all commands speech and language fluent Cranial nerve II-XII: Facial symmetry noted.   DIAGNOSTIC DATA (LABS, IMAGING, TESTING) - I reviewed patient records, labs, notes, testing and imaging myself where available.  Lab Results  Component Value Date   WBC 9.7 06/15/2015   HGB 16.4 06/15/2015   HCT 47.7 06/15/2015   MCV 88.1 06/15/2015   PLT 236 06/15/2015      Component Value Date/Time   NA 135 06/15/2015 1001   K 3.7 06/15/2015 1001   CL 104 06/15/2015 1001   CO2 24 06/15/2015 1001   GLUCOSE 105 (H) 06/15/2015 1001   BUN 9 06/15/2015 1001   CREATININE 0.90 06/15/2015 1001   CALCIUM 9.9 06/15/2015 1001   PROT 7.5 06/15/2015 1001   ALBUMIN 4.9 06/15/2015 1001   AST 26 06/15/2015 1001   ALT 32 06/15/2015 1001   ALKPHOS 78 06/15/2015 1001   BILITOT 0.7 06/15/2015 1001   GFRNONAA >60 06/15/2015 1001   GFRAA >60 06/15/2015 1001      ASSESSMENT AND PLAN 38 y.o. year old male  has a past medical history of Anxiety, Arthritis, Depression, GERD (gastroesophageal reflux disease), Headache, Hemochromatosis, Inguinal hernia, and Kidney stone (08/12/2011). here with:  OSA on CPAP  CPAP compliance excellent Residual AHI slightly elevated We will adjust pressure 9to 17 cm of water with EPR 2 Encouraged patient to continue using CPAP nightly and > 4 hours each night I will pull another download in 30 to 45 days.  If there is no improvement in his apnea we  will put in for CPAP titration  The patient's condition requires frequent monitoring and adjustments in the treatment plan, reflecting the ongoing complexity of care.  This provider is the continuing focal point for all needed services for this condition.  Clem Currier, MSN, NP-C 08/27/2023, 10:13 AM [facility] [address] [place], [place] [address] [phone]

## 2023-08-29 ENCOUNTER — Encounter: Payer: Self-pay | Admitting: Adult Health

## 2023-08-29 NOTE — Telephone Encounter (Signed)
 Pressure changed in Airview and order sent to [facility] for their records.

## 2023-10-17 ENCOUNTER — Telehealth: Payer: Self-pay | Admitting: *Deleted

## 2023-10-17 DIAGNOSIS — R351 Nocturia: Secondary | ICD-10-CM

## 2023-10-17 DIAGNOSIS — Z820 Family history of epilepsy and other diseases of the nervous system: Secondary | ICD-10-CM

## 2023-10-17 DIAGNOSIS — R0683 Snoring: Secondary | ICD-10-CM

## 2023-10-17 DIAGNOSIS — E663 Overweight: Secondary | ICD-10-CM

## 2023-10-17 DIAGNOSIS — G4733 Obstructive sleep apnea (adult) (pediatric): Secondary | ICD-10-CM

## 2023-10-17 DIAGNOSIS — G4719 Other hypersomnia: Secondary | ICD-10-CM

## 2023-10-17 DIAGNOSIS — R0681 Apnea, not elsewhere classified: Secondary | ICD-10-CM

## 2023-10-17 DIAGNOSIS — R519 Headache, unspecified: Secondary | ICD-10-CM

## 2023-10-17 NOTE — Telephone Encounter (Signed)
 Dr. Buck, I have adjusted his pressure but residual AHI remains elevated.  Do think he would benefit from a CPAP titration?

## 2023-10-17 NOTE — Telephone Encounter (Signed)
-----   Message from Duwaine Russell sent at 10/17/2023  9:01 AM EDT ----- Can we pull another CPAP DL ----- Message ----- From: Russell Duwaine, NP Sent: 09/26/2023  12:00 AM EDT To: Duwaine Russell, NP  Pull DL

## 2023-10-18 ENCOUNTER — Other Ambulatory Visit: Payer: Self-pay | Admitting: *Deleted

## 2023-10-18 DIAGNOSIS — R0681 Apnea, not elsewhere classified: Secondary | ICD-10-CM

## 2023-10-18 DIAGNOSIS — R519 Headache, unspecified: Secondary | ICD-10-CM

## 2023-10-18 DIAGNOSIS — Z820 Family history of epilepsy and other diseases of the nervous system: Secondary | ICD-10-CM

## 2023-10-18 DIAGNOSIS — R351 Nocturia: Secondary | ICD-10-CM

## 2023-10-18 DIAGNOSIS — E663 Overweight: Secondary | ICD-10-CM

## 2023-10-18 DIAGNOSIS — G4733 Obstructive sleep apnea (adult) (pediatric): Secondary | ICD-10-CM

## 2023-10-18 DIAGNOSIS — R0683 Snoring: Secondary | ICD-10-CM

## 2023-10-18 DIAGNOSIS — G4719 Other hypersomnia: Secondary | ICD-10-CM

## 2023-11-02 ENCOUNTER — Telehealth: Payer: Self-pay | Admitting: Adult Health

## 2023-11-02 NOTE — Telephone Encounter (Signed)
 CPAP [employer] shara: [phone] (exp. 10/29/23 to 04/26/24)  Sent mychart.

## 2023-11-05 NOTE — Telephone Encounter (Signed)
 Left voicemail for patient to call back to schedule. Left my direct number

## 2023-11-15 ENCOUNTER — Encounter

## 2023-12-02 ENCOUNTER — Ambulatory Visit (INDEPENDENT_AMBULATORY_CARE_PROVIDER_SITE_OTHER): Admitting: Neurology

## 2023-12-02 DIAGNOSIS — G4719 Other hypersomnia: Secondary | ICD-10-CM

## 2023-12-02 DIAGNOSIS — G4733 Obstructive sleep apnea (adult) (pediatric): Secondary | ICD-10-CM | POA: Diagnosis not present

## 2023-12-02 DIAGNOSIS — Z820 Family history of epilepsy and other diseases of the nervous system: Secondary | ICD-10-CM

## 2023-12-02 DIAGNOSIS — R0683 Snoring: Secondary | ICD-10-CM

## 2023-12-02 DIAGNOSIS — R351 Nocturia: Secondary | ICD-10-CM

## 2023-12-02 DIAGNOSIS — R519 Headache, unspecified: Secondary | ICD-10-CM

## 2023-12-02 DIAGNOSIS — R0681 Apnea, not elsewhere classified: Secondary | ICD-10-CM

## 2023-12-02 DIAGNOSIS — G4731 Primary central sleep apnea: Secondary | ICD-10-CM

## 2023-12-02 DIAGNOSIS — E663 Overweight: Secondary | ICD-10-CM

## 2023-12-02 DIAGNOSIS — G4720 Circadian rhythm sleep disorder, unspecified type: Secondary | ICD-10-CM

## 2023-12-07 NOTE — Procedures (Signed)
 Physician Interpretation: Please see link under Procedure Tab or under Encounters tab for physician report, technical report, as well as O2 titration and/or PAP titration tables (if applicable).   Referred by: Dr. Modena Callander   History and Indication for Testing (obtained from visit note dated 09/13/2023): 38 year old male with an underlying complex medical history of stroke in May 2025, hypertension, hyperlipidemia, coronary artery disease, prior smoking, COPD, history of kidney stone, carotid artery disease with status post bilateral carotid endarterectomies, hypothyroidism, and overweight state, who reports snoring and nocturia. His Epworth sleepiness score is 3 out of 24, fatigue severity score is 15 out of 63.     Review of the EEG showed no abnormal electrical discharges and symmetrical bihemispheric findings.     EKG: The EKG revealed normal sinus rhythm (NSR). ***   AUDIO/VIDEO REVIEW: The audio and video review did not show any abnormal or unusual behaviors, movements, phonations or vocalizations. The patient took *** restroom breaks. Snoring was noted, ***   POST-STUDY QUESTIONNAIRE: Post study, the patient indicated, that sleep was *** the same as usual.    IMPRESSION:    Obstructive Sleep Apnea (OSA), *** ***Central Sleep Apnea (CSA) ***Primary Snoring ***Primary Central Sleep Apnea ***Complex Sleep Apnea ***PLMD (periodic limb movement disorder [of sleep]) ***Dysfunctions associated with sleep stages or arousal from sleep ***Non-specific abnormal electrocardiogram (EKG) ***Poor sleep pattern ***Inconclusive Test   RECOMMENDATIONS:         I certify that I have reviewed the entire raw data recording prior to the issuance of this report in accordance with the Standards of Accreditation of the American Academy of Sleep Medicine (AASM).   True Mar, MD, PhD Medical Director, [facility]) Diplomat, ABPN (Neurology and  Sleep)

## 2023-12-10 ENCOUNTER — Ambulatory Visit: Payer: Self-pay | Admitting: Adult Health

## 2023-12-10 DIAGNOSIS — G4733 Obstructive sleep apnea (adult) (pediatric): Secondary | ICD-10-CM

## 2023-12-13 NOTE — Telephone Encounter (Signed)
LMVM for pt to return call for results.  ?

## 2023-12-13 NOTE — Telephone Encounter (Signed)
-----   Message from [facility] sent at 12/10/2023  4:51 PM EDT ----- Please let the patient know that his CPAP titration was not completely successful.  He still has central sleep apneas even while on a high pressure of CPAP and BiPAP.  She does want a try BiPAP  therapy if his insurance will allow.  If he is amendable I will be happy to place an order. ----- Message ----- From: Rebecka Fleeta Higashi In [facility] [employer] One Sent: 12/07/2023   1:06 PM EDT To: Duwaine Russell, NP

## 2023-12-18 NOTE — Telephone Encounter (Signed)
 I called pt.  Relayed message per below and he is amenable to start bipap.  He uses [facility].  Will call when gets new machine to set up 2-3 month follow up.  Aware to use 4 + hrs every night.

## 2023-12-18 NOTE — Telephone Encounter (Signed)
-----   Message from [facility] sent at 12/10/2023  4:51 PM EDT ----- Please let the patient know that his CPAP titration was not completely successful.  He still has central sleep apneas even while on a high pressure of CPAP and BiPAP.  She does want a try BiPAP  therapy if his insurance will allow.  If he is amendable I will be happy to place an order. ----- Message ----- From: Rebecka Fleeta Higashi In [facility] [employer] One Sent: 12/07/2023   1:06 PM EDT To: Duwaine Russell, NP

## 2023-12-19 NOTE — Telephone Encounter (Signed)
 Community message has been sent to [facility] for a new Bipap machine on 12/19/23. DD

## 2024-01-29 ENCOUNTER — Telehealth: Payer: Self-pay | Admitting: Adult Health

## 2024-01-29 NOTE — Telephone Encounter (Signed)
 Pt was scheduled for his initial CPAP on (03/04/24)  DME in pt's Snap Shot, between dates are 02/18/24-04/17/24, pt confirmed to be in [place] for this appointment.

## 2024-03-03 NOTE — Progress Notes (Unsigned)
 SABRA

## 2024-03-04 ENCOUNTER — Telehealth (INDEPENDENT_AMBULATORY_CARE_PROVIDER_SITE_OTHER): Admitting: Adult Health

## 2024-03-04 ENCOUNTER — Telehealth: Payer: Self-pay | Admitting: *Deleted

## 2024-03-04 DIAGNOSIS — G4733 Obstructive sleep apnea (adult) (pediatric): Secondary | ICD-10-CM | POA: Diagnosis not present

## 2024-03-04 NOTE — Telephone Encounter (Signed)
 Per Megan,NP wrote order for pt to change to  BI pap machine 12/19/2023 Pt states still has cpap machine . Sent message to [facility] today . Waiting on response

## 2024-03-04 NOTE — Progress Notes (Signed)
 "    PATIENT: Tony Parsons DOB: 24-Jun-1985  REASON FOR VISIT: follow up HISTORY FROM: patient PRIMARY NEUROLOGIST: Dr. Buck   Virtual Visit via Video Note  I connected with Camellia JAYSON Sharps on 03/04/2024 at  3:15 PM EST by a video enabled telemedicine application located remotely at home office and verified that I am speaking with the correct person using two identifiers who was located at their own home in [place].   I discussed the limitations of evaluation and management by telemedicine and the availability of in person appointments. The patient expressed understanding and agreed to proceed.   PATIENT: Tony Parsons DOB: [birth year]  REASON FOR VISIT: follow up HISTORY FROM: patient  HISTORY OF PRESENT ILLNESS: Today 03/04/2024:  Tony Parsons is a 38 y.o. male with a history of obstructive sleep apnea on CPAP. Returns today for follow-up.  Patient had a CPAP titration and an order was sent for him to get a BiPAP machine.  However it appears that the patient still has a CPAP machine.  The patient was also under the impression by the DME company that he was given a BiPAP machine.  We will reach out to the DME company to get clarification     08/27/23: [name] is a 38 y.o. male with a history of OSA on CPAP. Returns today for follow-up.  Reports that CPAP is working well.  Although his download shows that his apnea is still elevated.  We did increase the pressure at the last visit.  He does feel that when he puts the machine on the pressure is not strong enough.  His download is below     05/10/23: [name] is a 38 y.o. male with a history of obstructive sleep apnea on CPAP. Returns today for follow-up.  Reports that he feels that he can tell a difference since he has been using the machine.  He states he recently has had the flu so he has found it hard to use.  His download is below          REVIEW OF SYSTEMS: Out of a complete 14 system review of symptoms, the patient  complains only of the following symptoms, and all other reviewed systems are negative.  ALLERGIES: Allergies  Allergen Reactions   Oxycodone  Other (See Comments)    Keeps pt up all night   Codeine Itching    HOME MEDICATIONS: Outpatient Medications Prior to Visit  Medication Sig Dispense Refill   acetaminophen  (TYLENOL ) 500 MG tablet Take 1,000 mg by mouth every 6 (six) hours as needed. Reported on 07/07/2015     DULoxetine (CYMBALTA) 60 MG capsule Take 60 mg by mouth daily.     omeprazole (PRILOSEC) 40 MG capsule Take 40 mg by mouth daily.     Testosterone Cypionate 200 MG/ML SOLN Inject 100 mg as directed once a week.     No facility-administered medications prior to visit.    PAST MEDICAL HISTORY: Past Medical History:  Diagnosis Date   Anxiety    Arthritis    Rheumatoid - scheduling to see rheumatologist   Depression    GERD (gastroesophageal reflux disease)    Headache    migraines - 2x/week   Hemochromatosis    Inguinal hernia    Kidney stone 08/12/2011    PAST SURGICAL HISTORY: Past Surgical History:  Procedure Laterality Date   CHOLECYSTECTOMY N/A 07/01/2015   Procedure: LAPAROSCOPIC CHOLECYSTECTOMY WITH CHOLANGIOGRAM;  Surgeon: Elgin Laurence III, MD;  Location:  [facility];  Service: General;  Laterality: N/A;   ESOPHAGOGASTRODUODENOSCOPY (EGD) WITH PROPOFOL  N/A 06/24/2015   Procedure: ESOPHAGOGASTRODUODENOSCOPY (EGD) WITH PROPOFOL ;  Surgeon: Rogelia Copping, MD;  Location: [facility];  Service: Endoscopy;  Laterality: N/A;   INGUINAL HERNIA REPAIR  12/25/2011   Procedure: HERNIA REPAIR INGUINAL ADULT;  Surgeon: Deward GORMAN Curvin DOUGLAS, MD;  Location: [facility];  Service: General;  Laterality: Right;   UPPER GASTROINTESTINAL ENDOSCOPY  1999   WISDOM TOOTH EXTRACTION  2005    FAMILY HISTORY: Family History  Problem Relation Age of Onset   Diabetes Mother    Sleep apnea Mother    Hypertension Father    Arthritis Father    Melanoma Sister    Sleep apnea Maternal Aunt     Melanoma Maternal Grandmother    Narcolepsy Maternal Grandmother     SOCIAL HISTORY: Social History   Socioeconomic History   Marital status: Married    Spouse name: Not on file   Number of children: Not on file   Years of education: Not on file   Highest education level: Not on file  Occupational History   Not on file  Tobacco Use   Smoking status: Never   Smokeless tobacco: Never   Tobacco comments:    no passive smokers in home  Substance and Sexual Activity   Alcohol use: Not Currently    Comment: occ   Drug use: No   Sexual activity: Yes  Other Topics Concern   Not on file  Social History Narrative   Not on file   Social Drivers of Health   Tobacco Use: High Risk (06/14/2023)   Received from [facility]   Patient History    Smoking Tobacco Use: Never    Smokeless Tobacco Use: Current    Passive Exposure: Not on file  Financial Resource Strain: Not on file  Food Insecurity: Low Risk (01/23/2023)   Received from [facility]   Epic    Within the past 12 months, you worried that your food would run out before you got money to buy more: Never true    Within the past 12 months, the food you bought just didn't last and you didn't have money to get more. : Never true  Transportation Needs: No Transportation Needs (01/23/2023)   Received from [facility]    In the past 12 months, has lack of reliable transportation kept you from medical appointments, meetings, work or from getting things needed for daily living? : No  Physical Activity: Not on file  Stress: Not on file  Social Connections: Not on file  Intimate Partner Violence: Not on file  Depression (EYV7-0): Not on file  Alcohol Screen: Not on file  Housing: Low Risk (01/23/2023)   Received from [facility]   Epic    What is your living situation today?: I have a steady place to live    Think about the place you live. Do you have problems with any of the following? Choose all that  apply:: None/None on this list  Utilities: Low Risk (01/23/2023)   Received from [facility]   Utilities    In the past 12 months has the electric, gas, oil, or water company threatened to shut off services in your home? : No  Health Literacy: Not on file      PHYSICAL EXAM Generalized: Well developed, in no acute distress   Neurological examination  Mentation: Alert oriented to time, place, history taking. Follows  all commands speech and language fluent Cranial nerve II-XII: Facial symmetry noted.   DIAGNOSTIC DATA (LABS, IMAGING, TESTING) - I reviewed patient records, labs, notes, testing and imaging myself where available.  Lab Results  Component Value Date   WBC 9.7 06/15/2015   HGB 16.4 06/15/2015   HCT 47.7 06/15/2015   MCV 88.1 06/15/2015   PLT 236 06/15/2015      Component Value Date/Time   NA 135 06/15/2015 1001   K 3.7 06/15/2015 1001   CL 104 06/15/2015 1001   CO2 24 06/15/2015 1001   GLUCOSE 105 (H) 06/15/2015 1001   BUN 9 06/15/2015 1001   CREATININE 0.90 06/15/2015 1001   CALCIUM 9.9 06/15/2015 1001   PROT 7.5 06/15/2015 1001   ALBUMIN 4.9 06/15/2015 1001   AST 26 06/15/2015 1001   ALT 32 06/15/2015 1001   ALKPHOS 78 06/15/2015 1001   BILITOT 0.7 06/15/2015 1001   GFRNONAA >60 06/15/2015 1001   GFRAA >60 06/15/2015 1001     ASSESSMENT AND PLAN 38 y.o. year old male  has a past medical history of Anxiety, Arthritis, Depression, GERD (gastroesophageal reflux disease), Headache, Hemochromatosis, Inguinal hernia, and Kidney stone (08/12/2011). here with:  OSA on CPAP  We will call the patient's DME company to see if he was provided with another CPAP machine or a BiPAP machine. Patient voiced understanding  Advised that we would be in touch once we hear from DME company    Duwaine Russell, MSN, NP-C 03/04/2024, 3:18 PM [facility] [address] [place], [place] [address] [phone]  The patient's condition  requires frequent monitoring and adjustments in the treatment plan, reflecting the ongoing complexity of care.  This provider is the continuing focal point for all needed services for this condition.   "

## 2024-03-05 NOTE — Telephone Encounter (Signed)
 CM sent to [facility], he is reaching out to local office to have pt come in to check machine.

## 2024-04-04 ENCOUNTER — Encounter: Payer: Self-pay | Admitting: Adult Health

## 2024-04-07 NOTE — Telephone Encounter (Signed)
 SABRA

## 2024-04-07 NOTE — Telephone Encounter (Signed)
 See TE 03/04/24. Please advise
